# Patient Record
Sex: Male | Born: 1940 | Race: White | Hispanic: No | Smoking: Former smoker
Health system: Southern US, Community
[De-identification: ages and names within clinical notes are randomized; demographics above are authoritative.]

## PROBLEM LIST (undated history)

## (undated) DIAGNOSIS — E119 Type 2 diabetes mellitus without complications: Secondary | ICD-10-CM

## (undated) DIAGNOSIS — I714 Abdominal aortic aneurysm, without rupture, unspecified: Secondary | ICD-10-CM

## (undated) DIAGNOSIS — I10 Essential (primary) hypertension: Secondary | ICD-10-CM

## (undated) DIAGNOSIS — I340 Nonrheumatic mitral (valve) insufficiency: Secondary | ICD-10-CM

## (undated) DIAGNOSIS — M1990 Unspecified osteoarthritis, unspecified site: Secondary | ICD-10-CM

## (undated) DIAGNOSIS — I4891 Unspecified atrial fibrillation: Secondary | ICD-10-CM

## (undated) DIAGNOSIS — I219 Acute myocardial infarction, unspecified: Secondary | ICD-10-CM

## (undated) DIAGNOSIS — I701 Atherosclerosis of renal artery: Secondary | ICD-10-CM

## (undated) DIAGNOSIS — I2510 Atherosclerotic heart disease of native coronary artery without angina pectoris: Secondary | ICD-10-CM

## (undated) DIAGNOSIS — E785 Hyperlipidemia, unspecified: Secondary | ICD-10-CM

## (undated) DIAGNOSIS — I249 Acute ischemic heart disease, unspecified: Secondary | ICD-10-CM

## (undated) DIAGNOSIS — R37 Sexual dysfunction, unspecified: Secondary | ICD-10-CM

## (undated) HISTORY — DX: Sexual dysfunction, unspecified: R37

## (undated) HISTORY — DX: Acute myocardial infarction, unspecified: I21.9

## (undated) HISTORY — DX: Atherosclerosis of renal artery: I70.1

## (undated) HISTORY — DX: Unspecified osteoarthritis, unspecified site: M19.90

## (undated) HISTORY — DX: Abdominal aortic aneurysm, without rupture: I71.4

## (undated) HISTORY — PX: ESOPHAGOGASTRODUODENOSCOPY: SHX1529

## (undated) HISTORY — DX: Hyperlipidemia, unspecified: E78.5

## (undated) HISTORY — PX: FOOT SURGERY: SHX648

## (undated) HISTORY — DX: Atherosclerotic heart disease of native coronary artery without angina pectoris: I25.10

## (undated) HISTORY — DX: Nonrheumatic mitral (valve) insufficiency: I34.0

## (undated) HISTORY — DX: Essential (primary) hypertension: I10

## (undated) HISTORY — DX: Unspecified atrial fibrillation: I48.91

## (undated) HISTORY — PX: CIRCUMCISION: SUR203

## (undated) HISTORY — DX: Abdominal aortic aneurysm, without rupture, unspecified: I71.40

---

## 2000-09-19 ENCOUNTER — Encounter: Admission: RE | Admit: 2000-09-19 | Discharge: 2000-09-19 | Payer: Self-pay | Admitting: Gastroenterology

## 2000-09-19 ENCOUNTER — Encounter: Payer: Self-pay | Admitting: Gastroenterology

## 2004-07-15 ENCOUNTER — Ambulatory Visit: Payer: Self-pay | Admitting: Family Medicine

## 2004-08-21 ENCOUNTER — Ambulatory Visit: Payer: Self-pay | Admitting: Family Medicine

## 2004-09-06 DIAGNOSIS — I701 Atherosclerosis of renal artery: Secondary | ICD-10-CM

## 2004-09-06 HISTORY — DX: Atherosclerosis of renal artery: I70.1

## 2004-09-06 HISTORY — PX: CARDIAC CATHETERIZATION: SHX172

## 2004-10-16 ENCOUNTER — Ambulatory Visit: Payer: Self-pay | Admitting: Family Medicine

## 2005-01-06 ENCOUNTER — Ambulatory Visit: Payer: Self-pay | Admitting: Family Medicine

## 2005-01-27 ENCOUNTER — Ambulatory Visit: Payer: Self-pay | Admitting: Family Medicine

## 2005-05-12 ENCOUNTER — Ambulatory Visit: Payer: Self-pay | Admitting: Family Medicine

## 2005-07-13 ENCOUNTER — Ambulatory Visit: Payer: Self-pay | Admitting: Family Medicine

## 2005-07-16 ENCOUNTER — Ambulatory Visit: Payer: Self-pay | Admitting: Family Medicine

## 2005-07-19 ENCOUNTER — Ambulatory Visit: Payer: Self-pay | Admitting: Family Medicine

## 2005-07-22 ENCOUNTER — Ambulatory Visit: Payer: Self-pay | Admitting: Family Medicine

## 2005-09-07 ENCOUNTER — Ambulatory Visit: Payer: Self-pay | Admitting: Family Medicine

## 2005-09-30 ENCOUNTER — Ambulatory Visit: Payer: Self-pay | Admitting: Family Medicine

## 2005-11-24 ENCOUNTER — Ambulatory Visit: Payer: Self-pay | Admitting: Family Medicine

## 2009-12-04 ENCOUNTER — Ambulatory Visit: Payer: Self-pay | Admitting: Cardiovascular Disease

## 2009-12-12 ENCOUNTER — Telehealth: Payer: Self-pay | Admitting: Cardiovascular Disease

## 2010-03-31 ENCOUNTER — Ambulatory Visit (HOSPITAL_BASED_OUTPATIENT_CLINIC_OR_DEPARTMENT_OTHER): Admission: RE | Admit: 2010-03-31 | Discharge: 2010-03-31 | Payer: Self-pay | Admitting: Urology

## 2010-06-16 ENCOUNTER — Ambulatory Visit: Payer: Self-pay | Admitting: Cardiovascular Disease

## 2010-06-19 ENCOUNTER — Telehealth: Payer: Self-pay | Admitting: Cardiovascular Disease

## 2010-10-06 NOTE — Progress Notes (Signed)
  Phone Note Outgoing Call   Call placed by: Dessie Coma  LPN,  June 19, 2010 12:11 PM Call placed to: Patient Summary of Call: Patient's wife notified per Dr. Kirke Corin, ultrasound is fine.  Showed no aneurysm.

## 2010-10-06 NOTE — Progress Notes (Signed)
  Phone Note Outgoing Call   Call placed by: Dessie Coma LPN Call placed to: Patient Summary of Call: LM with wife- per Dr. Freida Busman, Echo showed normal heart function.

## 2010-11-21 LAB — BASIC METABOLIC PANEL
BUN: 22 mg/dL (ref 6–23)
CO2: 26 mEq/L (ref 19–32)
Calcium: 10.1 mg/dL (ref 8.4–10.5)
Chloride: 108 mEq/L (ref 96–112)
Creatinine, Ser: 1.44 mg/dL (ref 0.4–1.5)
GFR calc Af Amer: 59 mL/min — ABNORMAL LOW (ref >60)
GFR calc non Af Amer: 49 mL/min — ABNORMAL LOW (ref >60)
Glucose, Bld: 126 mg/dL — ABNORMAL HIGH (ref 70–99)
Potassium: 4.3 mEq/L (ref 3.5–5.1)
Sodium: 139 mEq/L (ref 135–145)

## 2010-11-21 LAB — PROTIME-INR
INR: 1.1 (ref 0.00–1.49)
Prothrombin Time: 14.1 seconds (ref 11.6–15.2)

## 2010-11-21 LAB — APTT: aPTT: 28 seconds (ref 24–37)

## 2010-11-21 LAB — POCT HEMOGLOBIN-HEMACUE: Hemoglobin: 15.2 g/dL (ref 13.0–17.0)

## 2010-12-15 ENCOUNTER — Encounter: Payer: Self-pay | Admitting: Cardiovascular Disease

## 2010-12-17 ENCOUNTER — Encounter: Payer: Self-pay | Admitting: Cardiovascular Disease

## 2010-12-17 ENCOUNTER — Ambulatory Visit (INDEPENDENT_AMBULATORY_CARE_PROVIDER_SITE_OTHER): Payer: Self-pay | Admitting: Cardiovascular Disease

## 2010-12-17 DIAGNOSIS — I2510 Atherosclerotic heart disease of native coronary artery without angina pectoris: Secondary | ICD-10-CM

## 2010-12-17 DIAGNOSIS — I10 Essential (primary) hypertension: Secondary | ICD-10-CM | POA: Insufficient documentation

## 2010-12-17 DIAGNOSIS — I4891 Unspecified atrial fibrillation: Secondary | ICD-10-CM

## 2010-12-17 NOTE — Assessment & Plan Note (Signed)
Chronic. His ventricular rate is well controlled with Diltiazem and Coreg. Continue long term with Warfarin with a goal INR of 2-3.

## 2010-12-17 NOTE — Assessment & Plan Note (Signed)
His BP is elevated. He was started on Coreg recently. 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 Ultrasound from last year showed a right renal size of 10.4 cm (with cortical thinning) compared to 13.5 on left.  Will continue medical therapy.

## 2010-12-17 NOTE — Progress Notes (Signed)
HPI  Marc [name] is a 70 y/o male who is here for a routine follow up visit. He has been doing well. No chest pain, dyspnea, palpitations or dizziness. His blood pressure has been running high recently. He was started on Coreg by PCP.   No Known Allergies   Current Outpatient Prescriptions on File Prior to Visit  Medication Sig Dispense Refill  . BENAZEPRIL HCL PO Take 20 mg by mouth 2 (two) times daily.        . Desloratadine (CLARINEX PO) Take 1 tablet by mouth at bedtime.        Marland Kitchen diltiazem (CARDIZEM CD) 300 MG 24 hr capsule Take 300 mg by mouth daily.        . FENOFIBRATE PO Take 200 mg by mouth 2 (two) times daily.        . pravastatin (PRAVACHOL) 40 MG tablet Take 40 mg by mouth daily.        . traMADol-acetaminophen (ULTRACET) 37.5-325 MG per tablet Take 1 tablet by mouth every 6 (six) hours as needed.        . warfarin (COUMADIN) 5 MG tablet Take 5 mg by mouth daily. Take as directed          Past Medical History  Diagnosis Date  . Renal artery stenosis   . Hyperlipidemia   . Sexual dysfunction   . Arthritis   . AAA (abdominal aortic aneurysm)     small. An abdominal ultrasound in 2001 showed no aneurysm  . Atrial fibrillation     permanent  . Coronary artery disease   . Hypertension      Past Surgical History  Procedure Date  . Cardiac catheterization 2006    showed an occluded Left circumflex with collaterals as well as 60% mid right coronary artery stenosis     No family history on file.   History   Social History  . Marital Status: Married    Spouse Name: N/A    Number of Children: N/A  . Years of Education: N/A   Occupational History  . Not on file.   Social History Main Topics  . Smoking status: Former Smoker    Quit date: 12/15/1983  . Smokeless tobacco: Not on file  . Alcohol Use: No  . Drug Use: No  . Sexually Active:    Other Topics Concern  . Not on file   Social History Narrative  . No narrative on file     ROS Constitutional:  Negative for fever, chills, diaphoresis, activity change, appetite change and fatigue.  HENT: Negative for hearing loss, nosebleeds, congestion, sore throat, facial swelling, drooling, trouble swallowing, neck pain, voice change, sinus pressure and tinnitus.  Eyes: Negative for photophobia, pain, discharge and visual disturbance.  Respiratory: Negative for apnea, cough, chest tightness, shortness of breath and wheezing.  Cardiovascular: Negative for chest pain, palpitations and leg swelling.  Gastrointestinal: Negative for nausea, vomiting, abdominal pain, diarrhea, constipation, blood in stool and abdominal distention.  Genitourinary: Negative for dysuria, urgency, frequency, hematuria and decreased urine volume.  Musculoskeletal: Negative for myalgias, back pain, joint swelling, arthralgias and gait problem.  Skin: Negative for color change, pallor, rash and wound.  Neurological: Negative for dizziness, tremors, seizures, syncope, speech difficulty, weakness, light-headedness, numbness and headaches.  Psychiatric/Behavioral: Negative for suicidal ideas, hallucinations, behavioral problems and agitation. The patient is not nervous/anxious.     PHYSICAL EXAM   BP 146/97  Pulse 84  Wt 205 lb 3.2 oz (93.078 kg)  SpO2 96%  Constitutional: He is oriented to person, place, and time. He appears well-developed and well-nourished. No distress.  HENT: No nasal discharge.  Head: Normocephalic and atraumatic.  Eyes: Pupils are equal, round, and reactive to light. Right eye exhibits no discharge. Left eye exhibits no discharge.  Neck: Normal range of motion. Neck supple. No JVD present. No thyromegaly present.  Cardiovascular: Normal rate, irrregular rhythm, normal heart sounds and intact distal pulses. Exam reveals no gallop and no friction rub.  No murmur heard.  Pulmonary/Chest: Effort normal and breath sounds normal. No stridor. No respiratory distress. He has no wheezes. He has no rales. He  exhibits no tenderness.  Abdominal: Soft. Bowel sounds are normal. He exhibits no distension. There is no tenderness. There is no rebound and no guarding.  Musculoskeletal: Normal range of motion. He exhibits no edema and no tenderness.  Neurological: He is alert and oriented to person, place, and time. Coordination normal.  Skin: Skin is warm and dry. No rash noted. He is not diaphoretic. No erythema. No pallor.  Psychiatric: He has a normal mood and affect. His behavior is normal. Judgment and thought content normal.        ASSESSMENT AND PLAN

## 2010-12-17 NOTE — Assessment & Plan Note (Addendum)
Without symptoms of angina at this time. Continue medical therapy. He is not on Aspirin given that he is on Warfarin without any history of PCI.

## 2011-01-11 ENCOUNTER — Telehealth: Payer: Self-pay | Admitting: Cardiovascular Disease

## 2011-01-19 NOTE — Assessment & Plan Note (Signed)
[facility]                        [place] [facility]IOLOG[facility] OFFICE NOTE   NAME:[name], Marc COLIN                      MRN:          [id number]  DATE:06/16/2010                            DOB:          [birth year]    [name] is a 70 year old gentleman who is here today for a followup  visit.  He has the following problem list:  1. Permanent atrial fibrillation.  2. Coronary artery disease with cardiac catheterization in 2006, which      showed an occluded left circumflex with collaterals as well as 60%      mid right coronary artery stenosis.  3. Renal artery stenosis detected during cardiac catheterization.  He      had subtotal occlusion of the right renal artery.  He was treated      medically given that he was asymptomatic without uncontrolled      hypertension.  4. A small abdominal aortic aneurysm.  5. Hypertension.  6. Hyperlipidemia.   INTERIM HISTORY:  Mr. Miske has been doing well overall with no reported  symptoms of chest pain, palpitations, dizziness, syncope, or presyncope.  He has chronic exertional dyspnea which has not changed recently.  There  has been no lower extremity edema.  He has been taking Coumadin without  any reported side effects.   MEDICATIONS:  1. Pravastatin 40 mg once daily.  2. Diltiazem extended release 300 mg once daily.  3. Fenofibrate 200 mg once daily.  4. Benazepril 20 mg twice daily.  5. Clarinex.  6. Coumadin as directed.   PHYSICAL EXAMINATION:  VITAL SIGNS:  Weight is 204.8 pounds, blood  pressure is 142/78, pulse is 74, oxygen saturation is 97% on room air.  NECK:  Reveals no JVD or carotid bruits.  LUNGS:  Clear to auscultation.  HEART:  Irregularly irregular with no gallops or murmurs.  ABDOMEN:  Benign, nontender, nondistended.  EXTREMITIES:  With no clubbing, cyanosis, or edema.   IMPRESSION:  1. Permanent atrial fibrillation:  His heart rate seems to be      reasonably controlled with diltiazem.   Most recent echocardiogram      in April of 2011, showed normal left ventricular systolic function,      moderately dilated left atrium, mitral valve prolapse with mild-to-      moderate mitral regurgitation.  At this time, I recommend      continuing rate control with diltiazem and long-term      anticoagulation with Coumadin as is being done.  2. Coronary artery disease:  He is currently not having any symptoms      suggestive of angina.  We will continue with medical therapy.  3. Hyperlipidemia:  Due to his history of coronary artery disease, I      recommended goals for his LDL should be less than 70.  4. History of abdominal aortic aneurysm:  We will obtain an ultrasound      of the aorta given that this has not been evaluated in the last few      years.  He will follow up with me in 6 months from now  or earlier      if needed.     Lorine Bears, MD  Electronically Signed    MA/MedQ  DD: 06/16/2010  DT: 06/16/2010  Job #: [id number]

## 2011-01-19 NOTE — Assessment & Plan Note (Signed)
[facility]                        [place] [facility]ARDIOLOG[facility] OFFICE NOTE   NAME:Marc [name], Marc [name]                      MRN:          [id number]  DATE:12/04/2009                            DOB:          Jan 23, 1941    CHIEF COMPLAINT:  Atrial fibrillation, hypertension, changing  cardiovascular care.   HISTORY OF PRESENT ILLNESS:  Marc [name] is a 70 year old white male, past  medical history significant for coronary artery disease with left heart  catheterization several years ago, atrial fibrillation on Coumadin,  hypertension, hyperlipidemia who is presenting to reestablish  cardiovascular care.  The patient states that approximately 3 years ago,  he underwent a heart catheterization for unclear reasons which  demonstrated coronary artery disease; however, no interventions were  taken secondary to collateral circulation per the patient.  He was  diagnosed with atrial fibrillation around that time and has been on  Coumadin since then.  He is asymptomatic from the atrial fibrillation.  He is retired from his job in [employer] but stays relatively active.  He denies any chest discomfort or shortness of breath other than  shortness of breath associated with some allergies during particular  seasons.  He also denies any dizziness or lower extremity edema.  He is  compliant with his medications.   PAST MEDICAL HISTORY:  As above in HPI.   SOCIAL HISTORY:  Quit smoking 30 years ago, does not use alcohol.   FAMILY HISTORY:  Positive for premature coronary artery disease.   ALLERGIES:  No known drug allergies.   MEDICATIONS:  Coumadin, diltiazem 300 mg daily, benazepril 20 mg b.i.d.,  fenofibrate 20 mg daily, pravastatin 40 mg every evening, Jantoven,  Clarinex.   REVIEW OF SYSTEMS:  As in HPI.  The patient also states he had skin  cancer in the past, it was not melanoma.  Other systems as in HPI,  otherwise negative.   PHYSICAL EXAMINATION:  VITAL SIGNS:   Blood pressure 136/76, pulse 57,  satting 96% on room air, and he weighs 204 pounds.  GENERAL:  No acute distress.  HEENT:  Normocephalic, atraumatic.  NECK:  Supple.  There is no JVD.  There are no carotid bruits.  HEART:  Irregularly irregular with a normal rate.  No murmur, rub, or  gallop.  LUNGS:  Clear bilaterally.  ABDOMEN:  Soft, nontender, nondistended.  EXTREMITIES:  Without edema.  SKIN:  Warm and dry.  PSYCHIATRIC:  The patient is appropriate with normal levels of insight.  Pulses 2+ bilateral radial and carotid pulses.  MUSCULOSKELETAL:  Bilateral upper and lower extremity strength 5/5.   Review of the patient's stress test in 2007, ejection fraction was 58%.  There is no ischemia seen on the scan.  Labs dated November 13, 2009,  sodium 142, potassium 4.2, chloride 105, CO2 23, BUN 16, creatinine 1.3,  glucose 118.  CBC, white count of 8, hemoglobin 14.5, hematocrit 42.3,  platelet count 186, total cholesterol 145, triglycerides 111, HDL 32,  LDL 91, hemoglobin A1c 6.6.  EKG taken today in clinic, independently  interpreted by myself demonstrates atrial fibrillation with a  ventricular rate of 79  beats per minute.   ASSESSMENT:  A 70 year old male with history of coronary artery disease  and chronic atrial fibrillation who is not having any symptoms  consistent with angina.  His atrial fibrillation is asymptomatic and has  been asymptomatic despite the tachycardia in the past.   PLAN:  1. Coronary artery disease.  The patient states he has not been taking      aspirin because he is on Coumadin.  He does not have a history of      myocardial infarction and for now will continue on the Coumadin.      He is on a statin.  2. Atrial fibrillation.  The patient is on diltiazem for rate control      on Coumadin for anticoagulation.  We will check a transthoracic      echocardiogram to ensure normal left ventricular systolic function      as the patient does state he has some  shortness of breath, although      he believes it is from his allergies.  I will also place him on a      24-hour Holter monitor to assure adequate rate control as the      patient is completely asymptomatic from the atrial fibrillation.  3. Hyperlipidemia.  He is currently taking pravastatin 40 mg daily,      and his LDL is 90.  This could be increased to 80 mg.  We will      defer this to his primary care physician who is following him      regularly.  4. Hypertension.  His blood pressure is under adequate control on      diltiazem and benazepril.  We will contact the patient once results      of his echo and the cardiac monitor are obtained.  He can follow up      with [place] in 6 months and more regularly with Dr. Benedetto Goad.     Brayton El, MD  Electronically Signed    SGA/MedQ  DD: 12/04/2009  DT: 12/05/2009  Job #: [id number]

## 2011-01-19 NOTE — Letter (Signed)
December 04, 2009    Elease Hashimoto A. Benedetto Goad, M.D.  [address]  [place], [place] [address]   RE:  Marc [name], Marc [name]  MRN:  [id number]  /  DOB:  1941/04/19   Dear Dr. Benedetto Goad,   I had the pleasure of seeing your patient, Marc [name], in clinic this  morning.  As you know he is a 70 year old male with a history of  coronary disease and chronic atrial fibrillation.  He is currently not  experiencing any angina and he is asymptomatic from his atrial  fibrillation.  I have ordered a 24-hour cardiac event monitor in order  to assure adequate rate control.  I am also checking a transthoracic  echocardiogram to evaluate his left ventricular systolic function, as he  does occasionally have some shortness of breath.  His last LDL was 91 on  pravastatin 40 mg daily.  This could be increased to 80 mg or the agent  could be changed to simvastatin or atorvastatin.  I will leave this to  your discretion.   I thank you for the referral of this patient and please contact my  office if I can be of any further assistance.    Sincerely,      Brayton El, MD  Electronically Signed    SGA/MedQ  DD: 12/04/2009  DT: 12/04/2009  Job #: [id number]

## 2011-02-05 HISTORY — PX: CARDIAC CATHETERIZATION: SHX172

## 2011-02-08 ENCOUNTER — Telehealth: Payer: Self-pay | Admitting: Cardiovascular Disease

## 2011-02-08 NOTE — Telephone Encounter (Signed)
Pts wife called and stated he was short of breath x 1 week.  Can't do anything without getting short of breath.  No edema in feet.  At night it is worse, can't seem to get any air.

## 2011-02-08 NOTE — Telephone Encounter (Signed)
Spoke with Marc [name], he reports being SOB for one to two weeks. He denies any edema and reports the SOB with any exertion. He is unable to tell if his heart rate is elevated or not. Marc [name] states he gets SOB when he lies down but it goes away. Marc [name] made an appt to see dr Kirke Corin on Monday 02-15-11. He will call prior to appt if symptoms change. Marc [name]

## 2011-02-15 ENCOUNTER — Ambulatory Visit (INDEPENDENT_AMBULATORY_CARE_PROVIDER_SITE_OTHER): Payer: Medicare Other | Admitting: Cardiovascular Disease

## 2011-02-15 ENCOUNTER — Encounter: Payer: Self-pay | Admitting: *Deleted

## 2011-02-15 ENCOUNTER — Encounter: Payer: Self-pay | Admitting: Cardiovascular Disease

## 2011-02-15 DIAGNOSIS — I4891 Unspecified atrial fibrillation: Secondary | ICD-10-CM

## 2011-02-15 DIAGNOSIS — I340 Nonrheumatic mitral (valve) insufficiency: Secondary | ICD-10-CM | POA: Insufficient documentation

## 2011-02-15 DIAGNOSIS — R0600 Dyspnea, unspecified: Secondary | ICD-10-CM

## 2011-02-15 DIAGNOSIS — I059 Rheumatic mitral valve disease, unspecified: Secondary | ICD-10-CM

## 2011-02-15 DIAGNOSIS — I2510 Atherosclerotic heart disease of native coronary artery without angina pectoris: Secondary | ICD-10-CM

## 2011-02-15 DIAGNOSIS — R079 Chest pain, unspecified: Secondary | ICD-10-CM

## 2011-02-15 NOTE — Patient Instructions (Signed)
Your physician recommends that you schedule a follow-up appointment in: after your test  Your physician has requested that you have an echocardiogram. Echocardiography is a painless test that uses sound waves to create images of your heart. It provides your doctor with information about the size and shape of your heart and how well your heart's chambers and valves are working. This procedure takes approximately one hour. There are no restrictions for this procedure.  Your physician has requested that you have a cardiac catheterization. Cardiac catheterization is used to diagnose and/or treat various heart conditions. Doctors may recommend this procedure for a number of different reasons. The most common reason is to evaluate chest pain. Chest pain can be a symptom of coronary artery disease (CAD), and cardiac catheterization can show whether plaque is narrowing or blocking your heart's arteries. This procedure is also used to evaluate the valves, as well as measure the blood flow and oxygen levels in different parts of your heart. For further information please visit [email or web address] Please follow instruction sheet, as given.

## 2011-02-15 NOTE — Assessment & Plan Note (Signed)
His heart rate is well controlled with diltiazem and carvedilol.

## 2011-02-15 NOTE — Progress Notes (Signed)
HPI  This is a 70 year old male who is being followed for chronic atrial fibrillation as well as coronary artery disease. He was last seen in April of this year. At that time he was doing very well with no reported chest pain or dyspnea. However, over the last 2-3 weeks the patient had progressive symptoms of dyspnea with minimal activities that currently interfering with his activities of daily living. He has not been able to do his yard work or other basic activities that he was able to do easily. He did have a few episodes of chest pain but most of his symptoms are manifested by dyspnea. He does describe difficulty in lying down but no PND. He has not had any lower extremity edema. He saw Dr. Shary Decamp recently and had labs checked which according to the patient were unremarkable. The patient has not had any palpitations or tachycardia. His atrial fibrillation has been reasonably controlled. The patient feels that he is gasping for air. He feels better breathing through his mouth.  No Known Allergies   Current Outpatient Prescriptions on File Prior to Visit  Medication Sig Dispense Refill  . BENAZEPRIL HCL PO Take 20 mg by mouth 2 (two) times daily.        . carvedilol (COREG) 12.5 MG tablet Take 12.5 mg by mouth 2 (two) times daily with a meal.        . diltiazem (CARDIZEM CD) 300 MG 24 hr capsule Take 300 mg by mouth daily.        . FENOFIBRATE PO Take 200 mg by mouth 2 (two) times daily.        . pravastatin (PRAVACHOL) 40 MG tablet Take 40 mg by mouth daily.        . traMADol-acetaminophen (ULTRACET) 37.5-325 MG per tablet Take 1 tablet by mouth every 6 (six) hours as needed.        . warfarin (COUMADIN) 5 MG tablet Take 5 mg by mouth daily. Take as directed       . DISCONTD: Desloratadine (CLARINEX PO) Take 1 tablet by mouth at bedtime.           Past Medical History  Diagnosis Date  . Renal artery stenosis   . Hyperlipidemia   . Sexual dysfunction   . Arthritis   . Hypertension   .  Renal artery stenosis in 1 of 2 vessels 2006    subtotal occlusion of right renal artery. Treated medically.   . Atrial fibrillation     permanent  . AAA (abdominal aortic aneurysm)     small. An abdominal ultrasound in 2011 showed no aneurysm  . Coronary artery disease   . Mitral regurgitation     with MVP. Mild to moderate     Past Surgical History  Procedure Date  . Cardiac catheterization 2006    showed an occluded Left circumflex with collaterals as well as 60% mid right coronary artery stenosis     History reviewed. No pertinent family history.   History   Social History  . Marital Status: Married    Spouse Name: N/A    Number of Children: N/A  . Years of Education: N/A   Occupational History  . Not on file.   Social History Main Topics  . Smoking status: Former Smoker    Quit date: 12/15/1983  . Smokeless tobacco: Not on file  . Alcohol Use: No  . Drug Use: No  . Sexually Active:    Other Topics Concern  . Not  on file   Social History Narrative  . No narrative on file       PHYSICAL EXAM   BP 147/94  Pulse 66  Ht 6\' 1"  (1.854 m)  Wt 202 lb (91.627 kg)  BMI 26.65 kg/m2  SpO2 95% Constitutional: He is oriented to person, place, and time. He appears well-developed and well-nourished. No distress.  HENT: No nasal discharge.  Head: Normocephalic and atraumatic.  Eyes: Pupils are equal, round, and reactive to light. Right eye exhibits no discharge. Left eye exhibits no discharge.  Neck: Normal range of motion. Neck supple. No JVD present. No thyromegaly present.  Cardiovascular: Irregularly irregular rhythm, normal heart sounds and intact distal pulses. Exam reveals no gallop and no friction rub.  No murmur heard.  Pulmonary/Chest: Effort normal and breath sounds normal. No stridor. No respiratory distress. He has no wheezes. He has no rales. He exhibits no tenderness.  Abdominal: Soft. Bowel sounds are normal. He exhibits no distension. There is  no tenderness. There is no rebound and no guarding.  Musculoskeletal: Normal range of motion. He exhibits no edema and no tenderness.  Neurological: He is alert and oriented to person, place, and time. Coordination normal.  Skin: Skin is warm and dry. No rash noted. He is not diaphoretic. No erythema. No pallor.  Psychiatric: He has a normal mood and affect. His behavior is normal. Judgment and thought content normal.       EKG: Atrial fibrillation with a ventricular rate of 65 beats per minute. No significant ST or T wave changes. No Q waves.   ASSESSMENT AND PLAN

## 2011-02-15 NOTE — Assessment & Plan Note (Addendum)
The patient's current symptoms of increased exertional dyspnea and occasional chest pain worrisome especially that has been stable for a while from a cardiac standpoint. His atrial fibrillation rate seems to be controlled and that does not seem to be an issue. The patient is known to have a history of coronary artery disease. Cardiac catheterization in 2006 showed an occluded left circumflex with a 60% lesion in the right coronary artery. I am concerned about the possibility of progression of his coronary artery disease. Due to severity of his symptoms, I recommend proceeding with cardiac catheterization and possible coronary intervention. Would hold his warfarin for 5 days before cardiac catheter and will likely do it through the radial artery. If PCI is needed, would favor a bare-metal stent. Start aspirin 81 mg daily.

## 2011-02-15 NOTE — Assessment & Plan Note (Signed)
The patient has known history of mitral valve prolapse with mild to moderate mitral regurgitation. Due to worsening of his symptoms, I will obtain an echocardiogram before his cardiac catheterization.

## 2011-02-16 ENCOUNTER — Telehealth: Payer: Self-pay | Admitting: *Deleted

## 2011-02-16 MED ORDER — ASPIRIN EC 81 MG PO TBEC: 81.0000 mg | DELAYED_RELEASE_TABLET | Freq: Every day | ORAL | Status: DC

## 2011-02-16 NOTE — Telephone Encounter (Signed)
Spoke with Ms Rivadeneira and told her that Dr Kirke Corin wants him to start taking an ASA 81 mg daily.

## 2011-02-17 ENCOUNTER — Encounter: Payer: Self-pay | Admitting: *Deleted

## 2011-02-19 ENCOUNTER — Ambulatory Visit (HOSPITAL_COMMUNITY): Payer: Medicare Other | Attending: Cardiovascular Disease

## 2011-02-19 DIAGNOSIS — R0600 Dyspnea, unspecified: Secondary | ICD-10-CM

## 2011-02-19 DIAGNOSIS — R0609 Other forms of dyspnea: Secondary | ICD-10-CM | POA: Insufficient documentation

## 2011-02-19 DIAGNOSIS — R0989 Other specified symptoms and signs involving the circulatory and respiratory systems: Secondary | ICD-10-CM | POA: Insufficient documentation

## 2011-02-19 DIAGNOSIS — I2510 Atherosclerotic heart disease of native coronary artery without angina pectoris: Secondary | ICD-10-CM | POA: Insufficient documentation

## 2011-02-19 DIAGNOSIS — R072 Precordial pain: Secondary | ICD-10-CM

## 2011-02-19 DIAGNOSIS — I059 Rheumatic mitral valve disease, unspecified: Secondary | ICD-10-CM | POA: Insufficient documentation

## 2011-02-19 DIAGNOSIS — I10 Essential (primary) hypertension: Secondary | ICD-10-CM | POA: Insufficient documentation

## 2011-02-19 DIAGNOSIS — I4891 Unspecified atrial fibrillation: Secondary | ICD-10-CM | POA: Insufficient documentation

## 2011-02-24 ENCOUNTER — Ambulatory Visit (HOSPITAL_COMMUNITY)
Admission: RE | Admit: 2011-02-24 | Discharge: 2011-02-24 | Disposition: A | Discharge status: Home / Self Care | Payer: Medicare Other | Source: Ambulatory Visit | Attending: Cardiovascular Disease | Admitting: Cardiovascular Disease

## 2011-02-24 DIAGNOSIS — I2510 Atherosclerotic heart disease of native coronary artery without angina pectoris: Secondary | ICD-10-CM

## 2011-02-24 DIAGNOSIS — Z01812 Encounter for preprocedural laboratory examination: Secondary | ICD-10-CM | POA: Insufficient documentation

## 2011-02-24 DIAGNOSIS — R0609 Other forms of dyspnea: Secondary | ICD-10-CM | POA: Insufficient documentation

## 2011-02-24 DIAGNOSIS — Z79899 Other long term (current) drug therapy: Secondary | ICD-10-CM | POA: Insufficient documentation

## 2011-02-24 DIAGNOSIS — R0989 Other specified symptoms and signs involving the circulatory and respiratory systems: Secondary | ICD-10-CM | POA: Insufficient documentation

## 2011-02-24 DIAGNOSIS — I4891 Unspecified atrial fibrillation: Secondary | ICD-10-CM | POA: Insufficient documentation

## 2011-02-24 LAB — CBC
HCT: 42.6 % (ref 39.0–52.0)
MCH: 30.3 pg (ref 26.0–34.0)
MCV: 88.4 fL (ref 78.0–100.0)
Platelets: 144 10*3/uL — ABNORMAL LOW (ref 150–400)
RBC: 4.82 MIL/uL (ref 4.22–5.81)

## 2011-02-24 LAB — BASIC METABOLIC PANEL
BUN: 20 mg/dL (ref 6–23)
CO2: 24 mEq/L (ref 19–32)
Chloride: 107 mEq/L (ref 96–112)
GFR calc non Af Amer: 49 mL/min — ABNORMAL LOW (ref >60)
Glucose, Bld: 137 mg/dL — ABNORMAL HIGH (ref 70–99)
Potassium: 4.3 mEq/L (ref 3.5–5.1)
Sodium: 140 mEq/L (ref 135–145)

## 2011-03-03 NOTE — Cardiovascular Report (Signed)
NAMECHISOM, AUST NO.:  [id number]  MEDICAL RECORD NO.:  [id number]  LOCATION:  [facility]                         FACILITY:  [facility]  PHYSICIAN:  Lorine Bears, MD     DATE OF BIRTH:  1940-11-05  DATE OF PROCEDURE: DATE OF DISCHARGE:  02/24/2011                           CARDIAC CATHETERIZATION   PRIMARY CARE PHYSICIAN:  Feliciana Rossetti, MD in [place].  PROCEDURES PERFORMED: 1. Left heart catheterization. 2. Coronary angiography. 3. Left ventricular angiography.  INDICATIONS AND CLINICAL HISTORY:  This is a 70 year old gentleman with chronic atrial fibrillation and known history of coronary artery disease who presented with symptoms of dyspnea with minimal activities, which has been progressive.  Symptoms were severe.  Due to severity of symptoms and previous history of known coronary artery disease, cardiac catheterization and possible coronary intervention was recommended. Risks, benefits, and alternatives were discussed with the patient.  ACCESS:  Right radial artery.  STUDY DETAILS:  A standard informed consent was obtained.  The right wrist was prepped in a sterile fashion.  It was anesthetized with 1% lidocaine.  A 5-French sheath was placed in the right radial artery after an anterior puncture.  Verapamil 3 mg was given through the sheath.  4000 units of unfractionated heparin was given intravenously. Coronary and left ventricular angiography was performed with a Jackie catheter.  The patient tolerated the procedure well with no immediate complications.  The sheath was then removed.  A TR band was applied.  STUDY FINDINGS:  Hemodynamic findings:  Central aortic pressure is 126/62 with a mean pressure of 84 mmHg.  Left ventricular pressure is 127/8 with left ventricular end-diastolic pressure of 15 mmHg.  Left ventricular angiography:  This was done with hand injection.  It showed mildly reduced LV systolic function with an estimated  ejection fraction of 45-50%.  Coronary angiography: Left main coronary artery:  The vessel is normal in size with 20% mid eccentric lesion. Left anterior descending artery:  The vessel is normal in size with mild- to-moderate calcifications especially in the proximal segment.  There is a 20% proximal stenosis at the first diagonal.  There is another 20% in the mid segment.  The rest of the vessel has minor irregularities. First diagonal is large in size and free of significant disease.  Second and third diagonals are normal in size and free of significant disease. Left circumflex artery:  The vessel is normal in size and nondominant. There is a 20% ostial lesion.  The rest of the vessel has minor irregularities without significant disease.  OM-1 and OM-2 are small branches.  OM-3 is normal in size and free of significant disease.  The AV groove artery appears to be small and possibly occluded, but it does not seem to supply a large territory. Right coronary artery:  The vessel is large size and dominant.  It is heavily calcified especially in the proximal and mid segments.  There is 40% discrete stenosis proximally followed by another 50-60% tubular stenosis in the proximal segment.  In the mid area, there is 30% diffuse disease and distally there is 40% diffuse disease.  The right PDA is normal in size and free of significant disease.  There are three posterolateral branches.  The largest is the second one and/or all are free of significant disease.  STUDY CONCLUSIONS: 1. Moderate right coronary artery disease without evidence of     obstructive coronary artery disease. 2. Mildly reduced LV systolic function (EF 45-50%) with mildly elevated left     ventricular end-diastolic pressure.  RECOMMENDATIONS:  Recommend medical therapy of nonobstructive coronary artery disease.  Continue rate control for atrial fibrillation and long- term anticoagulation.  Warfarin can be resumed  tonight.  We will consider adding a small dose diuretic.  Another consideration would be to stop diltiazem and switched to digoxin due to mildly reduced LV systolic function.  We will review his echocardiogram as well and make further adjustments in his medications.     Lorine Bears, MD     MA/MEDQ  D:  02/24/2011  T:  02/25/2011  Job:  [id number]  cc:   Feliciana Rossetti, MD  Electronically Signed by Lorine Bears MD on 03/03/2011 02:48:10 PM

## 2011-03-11 ENCOUNTER — Ambulatory Visit: Payer: Medicare Other | Admitting: Cardiovascular Disease

## 2011-03-22 ENCOUNTER — Encounter: Payer: Self-pay | Admitting: Cardiovascular Disease

## 2011-03-22 ENCOUNTER — Ambulatory Visit (INDEPENDENT_AMBULATORY_CARE_PROVIDER_SITE_OTHER): Payer: Medicare Other | Admitting: Cardiovascular Disease

## 2011-03-22 DIAGNOSIS — I2510 Atherosclerotic heart disease of native coronary artery without angina pectoris: Secondary | ICD-10-CM

## 2011-03-22 DIAGNOSIS — I340 Nonrheumatic mitral (valve) insufficiency: Secondary | ICD-10-CM

## 2011-03-22 DIAGNOSIS — I059 Rheumatic mitral valve disease, unspecified: Secondary | ICD-10-CM

## 2011-03-22 DIAGNOSIS — I4891 Unspecified atrial fibrillation: Secondary | ICD-10-CM

## 2011-03-22 NOTE — Assessment & Plan Note (Signed)
This is mild to moderate recent echocardiogram and unchanged from before.

## 2011-03-22 NOTE — Assessment & Plan Note (Signed)
This is chronic and being managed by rate control with carvedilol and diltiazem. Both would be continued. Ejection fraction was normal by echo. Continue long-term anticoagulation with warfarin with a goal INR between 2 and 3.

## 2011-03-22 NOTE — Assessment & Plan Note (Signed)
Recent cardiac catheterization showed no significant progression of coronary artery disease. I recommend continuing medical therapy as is being done. I suspect that his dyspnea is noncardiac. Although his left ventricular end-diastolic pressure was mildly elevated, I don't think that is contributing to his symptoms. He might have a problem with his airways. He is currently getting ENT evaluation. If that does not improve his symptoms, then consider evaluation by pulmonary.

## 2011-03-22 NOTE — Patient Instructions (Signed)
Your physician recommends that you schedule a follow-up appointment in: 6 months  

## 2011-03-22 NOTE — Progress Notes (Signed)
HPI  This is a 70 year old male who is here today for a followup visit. He was seen last month for increased dyspnea with minimal activities. He underwent cardiac catheterization which showed no progression of coronary artery disease with an ejection fraction of 50% with mildly elevated left ventricular end-diastolic pressure. Echocardiogram showed normal LV systolic function with mild to moderate mitral regurgitation and severely dilated left atrium likely due to atrial fibrillation. Overall, no change in his cardiac status. He denies any chest pain at this time. He still complains of dyspnea and feels that he does not get enough air inside his lungs especially when he breathes through his nose. He is getting ENT evaluation.  No Known Allergies   Current Outpatient Prescriptions on File Prior to Visit  Medication Sig Dispense Refill  . BENAZEPRIL HCL PO Take 20 mg by mouth 2 (two) times daily.        . carvedilol (COREG) 12.5 MG tablet Take 12.5 mg by mouth 2 (two) times daily with a meal.        . diltiazem (CARDIZEM CD) 300 MG 24 hr capsule Take 300 mg by mouth daily.        . pravastatin (PRAVACHOL) 40 MG tablet Take 40 mg by mouth daily.        . traMADol-acetaminophen (ULTRACET) 37.5-325 MG per tablet Take 1 tablet by mouth every 6 (six) hours as needed.        . warfarin (COUMADIN) 5 MG tablet Take 5 mg by mouth daily. Take as directed          Past Medical History  Diagnosis Date  . Renal artery stenosis   . Hyperlipidemia   . Sexual dysfunction   . Arthritis   . Hypertension   . Renal artery stenosis in 1 of 2 vessels 2006    subtotal occlusion of right renal artery. Treated medically.   . Atrial fibrillation     permanent  . AAA (abdominal aortic aneurysm)     small. An abdominal ultrasound in 2011 showed no aneurysm  . Coronary artery disease   . Mitral regurgitation     with MVP. Mild to moderate     Past Surgical History  Procedure Date  . Cardiac catheterization  2006    showed an occluded Left circumflex with collaterals as well as 60% mid right coronary artery stenosis  . Cardiac catheterization 02/2011    LM: 20%, LAD: calcified 20%, LCX: occluded distal AV groove supplying a small area, RCA: calcified with 50-60% proximal-mid disease     No family history on file.   History   Social History  . Marital Status: Married    Spouse Name: N/A    Number of Children: N/A  . Years of Education: N/A   Occupational History  . Not on file.   Social History Main Topics  . Smoking status: Former Smoker    Quit date: 12/15/1983  . Smokeless tobacco: Not on file  . Alcohol Use: No  . Drug Use: No  . Sexually Active:    Other Topics Concern  . Not on file   Social History Narrative  . No narrative on file      PHYSICAL EXAM   BP 143/77  Pulse 69  Ht 6\' 1"  (1.854 m)  Wt 205 lb (92.987 kg)  BMI 27.05 kg/m2  SpO2 96%  Constitutional: He is oriented to person, place, and time. He appears well-developed and well-nourished. No distress.  HENT: No nasal discharge.  Head: Normocephalic and atraumatic.  Eyes: Pupils are equal, round, and reactive to light. Right eye exhibits no discharge. Left eye exhibits no discharge.  Neck: Normal range of motion. Neck supple. No JVD present. No thyromegaly present.  Cardiovascular: Irregularly irregular, normal heart sounds and intact distal pulses. Exam reveals no gallop and no friction rub.  No murmur heard.  Pulmonary/Chest: Effort normal and breath sounds normal. No stridor. No respiratory distress. He has no wheezes. He has no rales. He exhibits no tenderness.  Abdominal: Soft. Bowel sounds are normal. He exhibits no distension. There is no tenderness. There is no rebound and no guarding.  Musculoskeletal: Normal range of motion. He exhibits no edema and no tenderness.  Neurological: He is alert and oriented to person, place, and time. Coordination normal.  Skin: Skin is warm and dry. No rash  noted. He is not diaphoretic. No erythema. No pallor.  Psychiatric: He has a normal mood and affect. His behavior is normal. Judgment and thought content normal.  The right radial area is intact with no evidence of hematoma.       ASSESSMENT AND PLAN

## 2011-04-06 LAB — PULMONARY FUNCTION TEST

## 2011-04-27 ENCOUNTER — Encounter: Payer: Self-pay | Admitting: Cardiovascular Disease

## 2011-09-30 ENCOUNTER — Encounter: Payer: Self-pay | Admitting: Pulmonary Disease

## 2011-09-30 ENCOUNTER — Ambulatory Visit (INDEPENDENT_AMBULATORY_CARE_PROVIDER_SITE_OTHER): Payer: Medicare Other | Admitting: Pulmonary Disease

## 2011-09-30 VITALS — BP 122/80 | HR 79 | Temp 98.1°F | Ht 73.0 in | Wt 208.6 lb

## 2011-09-30 DIAGNOSIS — J449 Chronic obstructive pulmonary disease, unspecified: Secondary | ICD-10-CM | POA: Insufficient documentation

## 2011-09-30 DIAGNOSIS — R0609 Other forms of dyspnea: Secondary | ICD-10-CM

## 2011-09-30 NOTE — Assessment & Plan Note (Signed)
The patient has dyspnea on exertion since May of last year, and I suspect it is multifactorial.  He does have airflow obstruction on his recent PFTs, however it is fairly mild.  He has no significant parenchymal lung disease on CT chest, and no thromboembolic disease.  He has been tried on symbicort at low dose and did not see a difference, but he may respond better to the higher dose.  I would first like to try him on Spiriva since he has significant air trapping by PFTs, and see how he responds.  The patient has definite heart disease with a severely dilated left atrium and mild to moderate mitral regurgitation, possibly suggesting an element of diastolic dysfunction as well.  I think conditioning may also be playing a role.

## 2011-09-30 NOTE — Patient Instructions (Signed)
Finish up your prednisone as instructed. Start spiriva one inhalation each am for next 4 weeks. Can use albuterol for rescue only up to every 6hrs.  Do not use if you are not having a lot of problems breathing that do not resolve with sitting down. followup with me in 4 weeks.

## 2011-09-30 NOTE — Progress Notes (Signed)
  Subjective:    Patient ID: Marc [name], male    DOB: [birth year], 71 y.o.   MRN: [id number]  HPI The patient is a 71 year old male who I've been asked to see for dyspnea on exertion.  The patient states he was in his usual state of health until May of last year when he began to notice dyspnea with heavier exertional activities.  This became progressively worse since that time.  He is unable to estimate walking distances before he will get winded, but believes it is at least moderate or greater distances.  He denies getting winded bringing groceries in from the car, but will get short of breath walking one flight of stairs.  He has no chronic cough at baseline, and denies lower extremity edema.  He has not had any chest congestion currently nor mucus production.  He was recently put on a prednisone taper for "bronchitis" the patient has had pulmonary function studies last year which shows very mild airflow obstruction, primarily manifested as air trapping.  He was tried on low dose symbicort, and saw no difference.  He is currently on albuterol and thinks this does help.  The patient has had a CT of his chest with no pulmonary emboli, and only emphysematous changes.  He tells me that he has had a CT of his sinuses that was unremarkable as well.  Patient has not had any prior history of asthma.  He does have known coronary disease, but a recent catheter showed stable anatomy.  He has had an echocardiogram which showed a normal left ventricle, but moderately to severely dilated left atrium with mild to moderate mitral regurgitation.  He did not have pulmonary hypertension noted.   Review of Systems  Constitutional: Negative for fever and unexpected weight change.  HENT: Positive for congestion. Negative for ear pain, nosebleeds, sore throat, rhinorrhea, sneezing, trouble swallowing, dental problem, postnasal drip and sinus pressure.   Eyes: Negative for redness and itching.  Respiratory: Positive for  cough and shortness of breath. Negative for chest tightness and wheezing.   Cardiovascular: Negative for palpitations and leg swelling.  Gastrointestinal: Negative for nausea and vomiting.  Genitourinary: Negative for dysuria.  Musculoskeletal: Negative for joint swelling.  Skin: Negative for rash.  Neurological: Negative for headaches.  Hematological: Does not bruise/bleed easily.  Psychiatric/Behavioral: Negative for dysphoric mood. The patient is not nervous/anxious.        Objective:   Physical Exam Constitutional:  Well developed, no acute distress  HENT:  Nares patent without discharge, deviated septum to left with narrowing.   Oropharynx without exudate, palate and uvula are normal  Eyes:  Perrla, eomi, no scleral icterus  Neck:  No JVD, no TMG  Cardiovascular:  irreg rhythm but cvr, no rubs or gallops.  No murmurs        Intact distal pulses  Pulmonary :  Normal breath sounds, no stridor or respiratory distress   No rales, rhonchi, or wheezing  Abdominal:  Soft, nondistended, bowel sounds present.  No tenderness noted.   Musculoskeletal:  No lower extremity edema noted.  Lymph Nodes:  No cervical lymphadenopathy noted  Skin:  No cyanosis noted  Neurologic:  Alert, appropriate, moves all 4 extremities without obvious deficit.         Assessment & Plan:

## 2011-10-28 ENCOUNTER — Ambulatory Visit (INDEPENDENT_AMBULATORY_CARE_PROVIDER_SITE_OTHER): Payer: Medicare Other | Admitting: Pulmonary Disease

## 2011-10-28 ENCOUNTER — Encounter: Payer: Self-pay | Admitting: Pulmonary Disease

## 2011-10-28 VITALS — BP 150/88 | HR 82 | Temp 97.8°F | Ht 73.0 in | Wt 213.0 lb

## 2011-10-28 DIAGNOSIS — J449 Chronic obstructive pulmonary disease, unspecified: Secondary | ICD-10-CM

## 2011-10-28 NOTE — Assessment & Plan Note (Signed)
The patient has seen some improvement with the Spiriva since the last visit, but not a significant change.  I have reminded him that his airflow obstruction is extremely mild, and is only playing a very small role in his overall dyspnea on exertion.  I would like to give him a little more time on the Spiriva since he has seen some progress, and he is to report back to me in about 4 weeks.  We could try the higher dose of symbicort, but he may do just as well with as needed albuterol given his very mild disease.  I've encouraged him to stay as active as possible and to improve his conditioning.

## 2011-10-28 NOTE — Patient Instructions (Signed)
Stay on spiriva each am for one more month.  Please call us in 4 weeks to give followup.  If this is not helping, we can try the higher strength of symbicort, although you may do just as well on albuterol as needed. Stay as active as possible.  followup with me in 6mos.

## 2011-10-28 NOTE — Progress Notes (Signed)
  Subjective:    Patient ID: Marc [name], male    DOB: 05-19-1941, 71 y.o.   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  He was started on Spiriva at the last visit, and has seen some improvement, but not a significant one.  He thinks it has helped his cough and congestion as well.   Review of Systems  Constitutional: Negative for fever and unexpected weight change.  HENT: Negative for ear pain, nosebleeds, congestion, sore throat, rhinorrhea, sneezing, trouble swallowing, dental problem, postnasal drip and sinus pressure.   Eyes: Negative for redness and itching.  Respiratory: Positive for cough, chest tightness, shortness of breath and wheezing.   Cardiovascular: Negative for palpitations and leg swelling.  Gastrointestinal: Negative for nausea and vomiting.  Genitourinary: Negative for dysuria.  Musculoskeletal: Negative for joint swelling.  Skin: Negative for rash.  Neurological: Negative for headaches.  Hematological: Bruises/bleeds easily.  Psychiatric/Behavioral: Negative for dysphoric mood. The patient is not nervous/anxious.        Objective:   Physical Exam Well-developed male in no acute distress Nose without purulence or discharge noted Chest with fairly clear breath sounds, no rhonchi or wheezes Cardiac exam with regular rate and rhythm Lower extremities without edema, no cyanosis noted Alert and oriented, moves all 4 extremities.       Assessment & Plan:

## 2012-04-27 ENCOUNTER — Encounter: Payer: Self-pay | Admitting: Pulmonary Disease

## 2012-04-27 ENCOUNTER — Ambulatory Visit (INDEPENDENT_AMBULATORY_CARE_PROVIDER_SITE_OTHER): Payer: Medicare Other | Admitting: Pulmonary Disease

## 2012-04-27 VITALS — BP 158/72 | HR 70 | Temp 97.9°F | Ht 73.0 in | Wt 207.8 lb

## 2012-04-27 DIAGNOSIS — J449 Chronic obstructive pulmonary disease, unspecified: Secondary | ICD-10-CM

## 2012-04-27 NOTE — Assessment & Plan Note (Signed)
The patient has very mild COPD, and is really not seen any improvement in his symptoms with maintenance bronchodilators.  I think a lot of his dyspnea on exertion is related to probable diastolic dysfunction.  In terms of treatment for his obstructive lung disease, I think as needed albuterol will benefit him just as much as any other medication.  I have also asked him to stay as active as possible.  He does not need ongoing followup with me, but I'm glad to see him if he has any worsening shortness of breath.

## 2012-04-27 NOTE — Progress Notes (Signed)
  Subjective:    Patient ID: Marc [name], male    DOB: 22-Jun-1941, 71 y.o.   MRN: [id number]  HPI Patient comes in today for followup of his mild COPD and dyspnea on exertion.  He did not see any significant improvement in his breathing with symbicort, and therefore was changed to Spiriva as a trial.  He saw some improvement, but not significant.  He feels the Spiriva increase his acid reflux, and therefore discontinued it, and has not seen any change in his breathing.  He denies any significant cough or congestion.   Review of Systems  Constitutional: Negative for fever and unexpected weight change.  HENT: Positive for rhinorrhea and postnasal drip. Negative for ear pain, nosebleeds, congestion, sore throat, sneezing, trouble swallowing, dental problem and sinus pressure.   Eyes: Negative for redness and itching.       Eyes "water" constantly  Respiratory: Negative for cough, chest tightness, shortness of breath and wheezing.   Cardiovascular: Negative for palpitations and leg swelling.  Gastrointestinal: Negative for nausea and vomiting.  Genitourinary: Negative for dysuria.  Musculoskeletal: Negative for joint swelling.  Skin: Negative for rash.  Neurological: Negative for headaches.  Hematological: Does not bruise/bleed easily.  Psychiatric/Behavioral: Negative for dysphoric mood. The patient is not nervous/anxious.        Objective:   Physical Exam Thin male in no acute distress Nose without purulence or discharge noted Chest completely clear to auscultation Cardiac exam with slightly irregular rhythm but controlled ventricular response. Lower extremities without edema, no cyanosis Alert and oriented, moves all 4 extremities.       Assessment & Plan:

## 2012-04-27 NOTE — Patient Instructions (Addendum)
Stay off maintenance medications for your breathing.  They have not really helped Stay on albuterol as needed for significant shortness of breath.  Can use 2 puffs up to every 6hrs if needed. followup with me as needed.

## 2013-09-07 NOTE — Telephone Encounter (Signed)
No other info °

## 2016-01-13 DIAGNOSIS — E782 Mixed hyperlipidemia: Secondary | ICD-10-CM | POA: Insufficient documentation

## 2016-01-13 DIAGNOSIS — N183 Chronic kidney disease, stage 3 unspecified: Secondary | ICD-10-CM | POA: Insufficient documentation

## 2016-01-13 DIAGNOSIS — Z7901 Long term (current) use of anticoagulants: Secondary | ICD-10-CM | POA: Insufficient documentation

## 2016-01-13 DIAGNOSIS — I5042 Chronic combined systolic (congestive) and diastolic (congestive) heart failure: Secondary | ICD-10-CM | POA: Insufficient documentation

## 2016-01-13 DIAGNOSIS — K219 Gastro-esophageal reflux disease without esophagitis: Secondary | ICD-10-CM | POA: Insufficient documentation

## 2016-01-13 DIAGNOSIS — Z79899 Other long term (current) drug therapy: Secondary | ICD-10-CM | POA: Insufficient documentation

## 2016-01-14 DIAGNOSIS — F411 Generalized anxiety disorder: Secondary | ICD-10-CM | POA: Insufficient documentation

## 2016-01-14 DIAGNOSIS — E119 Type 2 diabetes mellitus without complications: Secondary | ICD-10-CM | POA: Insufficient documentation

## 2016-01-14 DIAGNOSIS — Z794 Long term (current) use of insulin: Secondary | ICD-10-CM

## 2016-12-08 DIAGNOSIS — M25561 Pain in right knee: Secondary | ICD-10-CM | POA: Insufficient documentation

## 2016-12-08 DIAGNOSIS — M542 Cervicalgia: Secondary | ICD-10-CM | POA: Insufficient documentation

## 2016-12-08 DIAGNOSIS — M25562 Pain in left knee: Secondary | ICD-10-CM

## 2016-12-21 DIAGNOSIS — Z1389 Encounter for screening for other disorder: Secondary | ICD-10-CM | POA: Diagnosis not present

## 2016-12-21 DIAGNOSIS — M545 Low back pain: Secondary | ICD-10-CM | POA: Diagnosis not present

## 2016-12-21 DIAGNOSIS — I4891 Unspecified atrial fibrillation: Secondary | ICD-10-CM | POA: Diagnosis not present

## 2016-12-21 DIAGNOSIS — Z9181 History of falling: Secondary | ICD-10-CM | POA: Diagnosis not present

## 2016-12-21 DIAGNOSIS — Z7901 Long term (current) use of anticoagulants: Secondary | ICD-10-CM | POA: Diagnosis not present

## 2016-12-21 DIAGNOSIS — R413 Other amnesia: Secondary | ICD-10-CM | POA: Diagnosis not present

## 2016-12-28 DIAGNOSIS — R791 Abnormal coagulation profile: Secondary | ICD-10-CM | POA: Diagnosis not present

## 2016-12-30 DIAGNOSIS — E785 Hyperlipidemia, unspecified: Secondary | ICD-10-CM | POA: Diagnosis not present

## 2016-12-30 DIAGNOSIS — I10 Essential (primary) hypertension: Secondary | ICD-10-CM | POA: Diagnosis not present

## 2016-12-30 DIAGNOSIS — I2510 Atherosclerotic heart disease of native coronary artery without angina pectoris: Secondary | ICD-10-CM | POA: Diagnosis not present

## 2016-12-30 DIAGNOSIS — I482 Chronic atrial fibrillation: Secondary | ICD-10-CM | POA: Diagnosis not present

## 2017-01-05 DIAGNOSIS — R413 Other amnesia: Secondary | ICD-10-CM | POA: Diagnosis not present

## 2017-01-05 DIAGNOSIS — M545 Low back pain: Secondary | ICD-10-CM | POA: Diagnosis not present

## 2017-01-17 DIAGNOSIS — I482 Chronic atrial fibrillation: Secondary | ICD-10-CM | POA: Diagnosis not present

## 2017-01-17 DIAGNOSIS — R262 Difficulty in walking, not elsewhere classified: Secondary | ICD-10-CM | POA: Diagnosis not present

## 2017-02-01 DIAGNOSIS — C44329 Squamous cell carcinoma of skin of other parts of face: Secondary | ICD-10-CM | POA: Diagnosis not present

## 2017-02-08 DIAGNOSIS — Z7901 Long term (current) use of anticoagulants: Secondary | ICD-10-CM | POA: Diagnosis not present

## 2017-02-28 DIAGNOSIS — R791 Abnormal coagulation profile: Secondary | ICD-10-CM | POA: Diagnosis not present

## 2017-03-07 DIAGNOSIS — R791 Abnormal coagulation profile: Secondary | ICD-10-CM | POA: Diagnosis not present

## 2017-03-14 DIAGNOSIS — R791 Abnormal coagulation profile: Secondary | ICD-10-CM | POA: Diagnosis not present

## 2017-03-21 DIAGNOSIS — Z139 Encounter for screening, unspecified: Secondary | ICD-10-CM | POA: Diagnosis not present

## 2017-03-21 DIAGNOSIS — Z791 Long term (current) use of non-steroidal anti-inflammatories (NSAID): Secondary | ICD-10-CM | POA: Diagnosis not present

## 2017-03-21 DIAGNOSIS — Z7901 Long term (current) use of anticoagulants: Secondary | ICD-10-CM | POA: Diagnosis not present

## 2017-03-21 DIAGNOSIS — I482 Chronic atrial fibrillation: Secondary | ICD-10-CM | POA: Diagnosis not present

## 2017-03-21 DIAGNOSIS — R791 Abnormal coagulation profile: Secondary | ICD-10-CM | POA: Diagnosis not present

## 2017-03-21 DIAGNOSIS — I10 Essential (primary) hypertension: Secondary | ICD-10-CM | POA: Diagnosis not present

## 2017-04-21 DIAGNOSIS — I10 Essential (primary) hypertension: Secondary | ICD-10-CM | POA: Diagnosis not present

## 2017-04-21 DIAGNOSIS — I482 Chronic atrial fibrillation: Secondary | ICD-10-CM | POA: Diagnosis not present

## 2017-04-21 DIAGNOSIS — E7800 Pure hypercholesterolemia, unspecified: Secondary | ICD-10-CM | POA: Diagnosis not present

## 2017-04-21 DIAGNOSIS — E119 Type 2 diabetes mellitus without complications: Secondary | ICD-10-CM | POA: Diagnosis not present

## 2017-04-21 DIAGNOSIS — Z7901 Long term (current) use of anticoagulants: Secondary | ICD-10-CM | POA: Diagnosis not present

## 2017-05-23 DIAGNOSIS — Z7901 Long term (current) use of anticoagulants: Secondary | ICD-10-CM | POA: Diagnosis not present

## 2017-06-22 DIAGNOSIS — Z7901 Long term (current) use of anticoagulants: Secondary | ICD-10-CM | POA: Diagnosis not present

## 2017-06-24 DIAGNOSIS — Z2821 Immunization not carried out because of patient refusal: Secondary | ICD-10-CM | POA: Diagnosis not present

## 2017-06-24 DIAGNOSIS — R202 Paresthesia of skin: Secondary | ICD-10-CM | POA: Diagnosis not present

## 2017-07-22 DIAGNOSIS — E7800 Pure hypercholesterolemia, unspecified: Secondary | ICD-10-CM | POA: Diagnosis not present

## 2017-07-22 DIAGNOSIS — Z2821 Immunization not carried out because of patient refusal: Secondary | ICD-10-CM | POA: Diagnosis not present

## 2017-07-22 DIAGNOSIS — E119 Type 2 diabetes mellitus without complications: Secondary | ICD-10-CM | POA: Diagnosis not present

## 2017-07-22 DIAGNOSIS — I10 Essential (primary) hypertension: Secondary | ICD-10-CM | POA: Diagnosis not present

## 2017-07-22 DIAGNOSIS — Z7901 Long term (current) use of anticoagulants: Secondary | ICD-10-CM | POA: Diagnosis not present

## 2017-08-04 DIAGNOSIS — C44311 Basal cell carcinoma of skin of nose: Secondary | ICD-10-CM | POA: Diagnosis not present

## 2017-08-04 DIAGNOSIS — C44529 Squamous cell carcinoma of skin of other part of trunk: Secondary | ICD-10-CM | POA: Diagnosis not present

## 2017-08-04 DIAGNOSIS — L570 Actinic keratosis: Secondary | ICD-10-CM | POA: Diagnosis not present

## 2017-08-23 DIAGNOSIS — Z7901 Long term (current) use of anticoagulants: Secondary | ICD-10-CM | POA: Diagnosis not present

## 2017-08-26 ENCOUNTER — Encounter: Payer: Self-pay | Admitting: Cardiology

## 2017-08-26 ENCOUNTER — Ambulatory Visit (INDEPENDENT_AMBULATORY_CARE_PROVIDER_SITE_OTHER): Payer: Medicare Other | Admitting: Cardiology

## 2017-08-26 VITALS — BP 150/100 | HR 81 | Ht 73.0 in | Wt 197.0 lb

## 2017-08-26 DIAGNOSIS — I2510 Atherosclerotic heart disease of native coronary artery without angina pectoris: Secondary | ICD-10-CM | POA: Diagnosis not present

## 2017-08-26 DIAGNOSIS — I10 Essential (primary) hypertension: Secondary | ICD-10-CM

## 2017-08-26 DIAGNOSIS — J431 Panlobular emphysema: Secondary | ICD-10-CM | POA: Diagnosis not present

## 2017-08-26 DIAGNOSIS — I340 Nonrheumatic mitral (valve) insufficiency: Secondary | ICD-10-CM | POA: Diagnosis not present

## 2017-08-26 DIAGNOSIS — I4891 Unspecified atrial fibrillation: Secondary | ICD-10-CM | POA: Diagnosis not present

## 2017-08-26 NOTE — Patient Instructions (Signed)
Medication Instructions:  Your physician recommends that you continue on your current medications as directed. Please refer to the Current Medication list given to you today.  Labwork: None ordered  Testing/Procedures: None ordered  Follow-Up: Your physician recommends that you schedule a follow-up appointment in: 7 months with Dr. Geraldo Pitter   Any Other Special Instructions Will Be Listed Below (If Applicable).     If you need a refill on your cardiac medications before your next appointment, please call your pharmacy.

## 2017-08-26 NOTE — Progress Notes (Signed)
Cardiology Office Note:    Date:  08/26/2017   ID:  Marc [name], DOB 07-19-1941, MRN [id number]  PCP:  Raina Mina., MD  Cardiologist:  Jenean Lindau, MD   Referring MD: Raina Mina., MD    ASSESSMENT:    1. Atrial fibrillation, unspecified type ([place])   2. Coronary artery disease involving native coronary artery of native heart without angina pectoris   3. Essential hypertension   4. Mitral valve insufficiency, unspecified etiology   5. Panlobular emphysema ([place])    PLAN:    In order of problems listed above:  1. Secondary prevention stressed with the patient.  Importance of compliance with diet and medications stressed.  Importance of regular walking stressed he vocalized understanding. 2. His blood pressure is stable.  Lipids are followed by his primary care physician. 3. His atrial fibrillation is stable. .I discussed with the patient atrial fibrillation, disease process. Management and therapy including rate and rhythm control, anticoagulation benefits and potential risks were discussed extensively with the patient. Patient had multiple questions which were answered to patient's satisfaction.  Newer Anticoagulants were discussed but is not keen on that. 4. 8 months follow-up or earlier if he has any concerns.   Medication Adjustments/Labs and Tests Ordered: Current medicines are reviewed at length with the patient today.  Concerns regarding medicines are outlined above.  Orders Placed This Encounter  Procedures  . EKG 12-Lead   No orders of the defined types were placed in this encounter.    History of Present Illness:    Marc [name] is a 76 y.o. male who is being seen today for the evaluation of atrial fibrillation at the request of Raina Mina., MD.  Patient is a pleasant 76 year old male this patient has been under my care in my previous practice.  He is here now to transfer his care and be established with my current practice.  The patient is here  for follow-up.  He has atrial fibrillation and is on anticoagulation with warfarin.  He tells me that these checks with his primary care physician for pro times are periodic and stable.  He denies any problems at this time.  He lost his wife recently.  No chest pain orthopnea or PND.  He walks on a regular basis but does not exercise.  He leads a sedentary lifestyle.  At the time of my evaluation, the patient is alert awake oriented and in no distress.  Past Medical History:  Diagnosis Date  . AAA (abdominal aortic aneurysm) (HCC)    small. An abdominal ultrasound in 2011 showed no aneurysm  . Arthritis   . Atrial fibrillation ([place])    permanent  . Coronary artery disease   . Hyperlipidemia   . Hypertension   . Mitral regurgitation    with MVP. Mild to moderate  . Renal artery stenosis ([place])   . Renal artery stenosis in 1 of 2 vessels ([place]) 2006   subtotal occlusion of right renal artery. Treated medically.   . Sexual dysfunction     Past Surgical History:  Procedure Laterality Date  . CARDIAC CATHETERIZATION  2006   showed an occluded Left circumflex with collaterals as well as 60% mid right coronary artery stenosis  . CARDIAC CATHETERIZATION  02/2011   LM: 20%, LAD: calcified 20%, LCX: occluded distal AV groove supplying a small area, RCA: calcified with 50-60% proximal-mid disease    Current Medications: Current Meds  Medication Sig  . ALPRAZolam (XANAX) 0.5 MG  tablet TAKE 1/2 TABLET BY MOUTH EVERY 6 HOURS AS NEEDED  . BENAZEPRIL HCL PO Take 20 mg by mouth 2 (two) times daily.    . carvedilol (COREG) 12.5 MG tablet Take 12.5 mg by mouth 2 (two) times daily with a meal.    . diltiazem (CARDIZEM CD) 300 MG 24 hr capsule Take 300 mg by mouth daily.   . fenofibrate 160 MG tablet TAKE 1 TABLET BY MOUTH ONCE DAILY  . fenofibrate micronized (LOFIBRA) 134 MG capsule Take 134 mg by mouth daily before breakfast.  . glipiZIDE (GLUCOTROL XL) 5 MG 24 hr tablet TAKE 1 TABLET BY MOUTH DAILY    . Insulin Glargine (LANTUS SOLOSTAR) 100 UNIT/ML Solostar Pen INJECT 7 UNITS SUB-Q DAILY AT BEDTIME.  . Insulin Pen Needle (NOVOFINE) 32G X 6 MM MISC USE AS DIRECTED.  Marland Kitchen JANTOVEN 4 MG tablet daily.  . nitroGLYCERIN (NITROSTAT) 0.4 MG SL tablet Place 0.4 mg under the tongue.  . pravastatin (PRAVACHOL) 40 MG tablet Take 40 mg by mouth daily.    . sertraline (ZOLOFT) 50 MG tablet daily.  . traMADol-acetaminophen (ULTRACET) 37.5-325 MG per tablet Take 1 tablet by mouth every 6 (six) hours as needed.    . [DISCONTINUED] albuterol (PROAIR HFA) 108 (90 BASE) MCG/ACT inhaler Inhale 2 puffs into the lungs every 6 (six) hours as needed.     Allergies:   Cephalexin; Prednisone; Simvastatin; and Ticagrelor   Social History   Socioeconomic History  . Marital status: Married    Spouse name: None  . Number of children: None  . Years of education: None  . Highest education level: None  Social Needs  . Financial resource strain: None  . Food insecurity - worry: None  . Food insecurity - inability: None  . Transportation needs - medical: None  . Transportation needs - non-medical: None  Occupational History  . Occupation: retired Research scientist (physical sciences): [employer]  Tobacco Use  . Smoking status: Former Smoker    Packs/day: 0.10    Years: 30.00    Pack years: 3.00    Types: Cigarettes    Last attempt to quit: 12/15/1983    Years since quitting: 33.7  . Smokeless tobacco: Former Systems developer    Types: [place] date: 09/07/1983  Substance and Sexual Activity  . Alcohol use: No  . Drug use: No  . Sexual activity: None  Other Topics Concern  . None  Social History Narrative  . None     Family History: The patient's family history is not on file.  ROS:   Please see the history of present illness.    All other systems reviewed and are negative.  EKGs/Labs/Other Studies Reviewed:    The following studies were reviewed today: I reviewed my findings with the patient at extensive length.  EKG  reveals patient with well-controlled ventricular rate.   Recent Labs: No results found for requested labs within last 8760 hours.  Recent Lipid Panel No results found for: CHOL, TRIG, HDL, CHOLHDL, VLDL, LDLCALC, LDLDIRECT  Physical Exam:    VS:  BP (!) 150/100 (BP Location: Right Arm, Patient Position: Sitting, Cuff Size: Normal)   Pulse 81   Ht 6\' 1"  (1.854 m)   Wt 197 lb (89.4 kg)   SpO2 98%   BMI 25.99 kg/m     Wt Readings from Last 3 Encounters:  08/26/17 197 lb (89.4 kg)  04/27/12 207 lb 12.8 oz (94.3 kg)  10/28/11 213 lb (96.6 kg)  GEN: Patient is in no acute distress HEENT: Normal NECK: No JVD; No carotid bruits LYMPHATICS: No lymphadenopathy CARDIAC: S1 S2 irregular, 2/6 systolic murmur at the apex. RESPIRATORY:  Clear to auscultation without rales, wheezing or rhonchi  ABDOMEN: Soft, non-tender, non-distended MUSCULOSKELETAL:  No edema; No deformity  SKIN: Warm and dry NEUROLOGIC:  Alert and oriented x 3 PSYCHIATRIC:  Normal affect    Signed, Jenean Lindau, MD  08/26/2017 4:45 PM    [facility]

## 2017-09-06 HISTORY — PX: COLONOSCOPY: SHX174

## 2017-09-07 DIAGNOSIS — Z125 Encounter for screening for malignant neoplasm of prostate: Secondary | ICD-10-CM | POA: Diagnosis not present

## 2017-09-07 DIAGNOSIS — E785 Hyperlipidemia, unspecified: Secondary | ICD-10-CM | POA: Diagnosis not present

## 2017-09-07 DIAGNOSIS — Z7901 Long term (current) use of anticoagulants: Secondary | ICD-10-CM | POA: Diagnosis not present

## 2017-09-07 DIAGNOSIS — Z9181 History of falling: Secondary | ICD-10-CM | POA: Diagnosis not present

## 2017-09-07 DIAGNOSIS — Z0000 Encounter for general adult medical examination without abnormal findings: Secondary | ICD-10-CM | POA: Diagnosis not present

## 2017-09-07 DIAGNOSIS — Z1331 Encounter for screening for depression: Secondary | ICD-10-CM | POA: Diagnosis not present

## 2017-10-10 DIAGNOSIS — R310 Gross hematuria: Secondary | ICD-10-CM | POA: Diagnosis not present

## 2017-10-10 DIAGNOSIS — Z7901 Long term (current) use of anticoagulants: Secondary | ICD-10-CM | POA: Diagnosis not present

## 2017-10-12 DIAGNOSIS — R791 Abnormal coagulation profile: Secondary | ICD-10-CM | POA: Diagnosis not present

## 2017-10-12 DIAGNOSIS — R310 Gross hematuria: Secondary | ICD-10-CM | POA: Diagnosis not present

## 2017-10-13 DIAGNOSIS — N401 Enlarged prostate with lower urinary tract symptoms: Secondary | ICD-10-CM | POA: Diagnosis not present

## 2017-10-13 DIAGNOSIS — Z79899 Other long term (current) drug therapy: Secondary | ICD-10-CM | POA: Diagnosis not present

## 2017-10-13 DIAGNOSIS — Z7901 Long term (current) use of anticoagulants: Secondary | ICD-10-CM | POA: Diagnosis not present

## 2017-10-13 DIAGNOSIS — R310 Gross hematuria: Secondary | ICD-10-CM | POA: Diagnosis not present

## 2017-10-14 DIAGNOSIS — R791 Abnormal coagulation profile: Secondary | ICD-10-CM | POA: Diagnosis not present

## 2017-10-21 DIAGNOSIS — N200 Calculus of kidney: Secondary | ICD-10-CM | POA: Diagnosis not present

## 2017-10-21 DIAGNOSIS — R310 Gross hematuria: Secondary | ICD-10-CM | POA: Diagnosis not present

## 2017-10-25 ENCOUNTER — Encounter: Payer: Self-pay | Admitting: Cardiology

## 2017-10-25 ENCOUNTER — Other Ambulatory Visit: Payer: Self-pay

## 2017-10-25 ENCOUNTER — Ambulatory Visit (INDEPENDENT_AMBULATORY_CARE_PROVIDER_SITE_OTHER): Payer: Medicare Other | Admitting: Cardiology

## 2017-10-25 VITALS — BP 148/70 | HR 71 | Ht 73.0 in | Wt 197.0 lb

## 2017-10-25 DIAGNOSIS — Z7901 Long term (current) use of anticoagulants: Secondary | ICD-10-CM

## 2017-10-25 DIAGNOSIS — Z794 Long term (current) use of insulin: Secondary | ICD-10-CM | POA: Diagnosis not present

## 2017-10-25 DIAGNOSIS — I2510 Atherosclerotic heart disease of native coronary artery without angina pectoris: Secondary | ICD-10-CM

## 2017-10-25 DIAGNOSIS — I10 Essential (primary) hypertension: Secondary | ICD-10-CM

## 2017-10-25 DIAGNOSIS — E119 Type 2 diabetes mellitus without complications: Secondary | ICD-10-CM | POA: Diagnosis not present

## 2017-10-25 DIAGNOSIS — E782 Mixed hyperlipidemia: Secondary | ICD-10-CM

## 2017-10-25 DIAGNOSIS — I480 Paroxysmal atrial fibrillation: Secondary | ICD-10-CM

## 2017-10-25 DIAGNOSIS — I340 Nonrheumatic mitral (valve) insufficiency: Secondary | ICD-10-CM | POA: Diagnosis not present

## 2017-10-25 NOTE — Patient Instructions (Signed)

## 2017-10-25 NOTE — Progress Notes (Signed)
Cardiology Office Note:    Date:  10/25/2017   ID:  [name], DOB [birth year], MRN [id number]  PCP:  Raina Mina., MD  Cardiologist:  Jenean Lindau, MD   Referring MD: Raina Mina., MD    ASSESSMENT:    1. Paroxysmal atrial fibrillation (HCC)   2. Coronary artery disease involving native coronary artery of native heart without angina pectoris   3. Essential hypertension   4. Mitral valve insufficiency, unspecified etiology   5. Type 2 diabetes mellitus without complication, with long-term current use of insulin ([place])   6. Long term current use of anticoagulant therapy   7. Mixed hyperlipidemia    PLAN:    In order of problems listed above:  1. Secondary prevention stressed with the patient.  Importance of compliance with diet and medications stressed and he vocalized understanding.  He leads a sedentary lifestyle.  Importance of exercise stressed and he plans to exercise on a regular basis.  He is planning to undergo blood work in the next few days and his daughter will send it to me. 2. I will try to obtain a report of coronary angiography done at [facility]. 3. Blood pressure is elevated and we will have a blood pressure check done by his daughter and she will fax me the reports. 4. CT scan revealed aortic calcifications and again aggressive secondary prevention was stressed. 5. Patient will be seen in follow-up appointment in 6 months or earlier if the patient has any concerns    Medication Adjustments/Labs and Tests Ordered: Current medicines are reviewed at length with the patient today.  Concerns regarding medicines are outlined above.  No orders of the defined types were placed in this encounter.  No orders of the defined types were placed in this encounter.    Chief Complaint  Patient presents with  . Follow-up  . Atrial Fibrillation     History of Present Illness:    JEFFRIE LOFSTROM is a 77 y.o. male.  Patient has known  coronary artery disease in approximately fibrillation.  He denies any problems at this time and takes care of activities of daily living.  No chest pain orthopnea or PND.  He has approximately fibrillation.  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  He mentions to me that his pro time was elevated the last time.  His medication was switched from warfarin to Xarelto and he is tolerating it well.  His daughter accompanies him.  No chest pain orthopnea or PND.  At the time of my evaluation, the patient is alert awake oriented and in no distress.  Past Medical History:  Diagnosis Date  . AAA (abdominal aortic aneurysm) (HCC)    small. An abdominal ultrasound in 2011 showed no aneurysm  . Arthritis   . Atrial fibrillation ([place])    permanent  . Coronary artery disease   . Hyperlipidemia   . Hypertension   . Mitral regurgitation    with MVP. Mild to moderate  . Renal artery stenosis ([place])   . Renal artery stenosis in 1 of 2 vessels ([place]) 2006   subtotal occlusion of right renal artery. Treated medically.   . Sexual dysfunction     Past Surgical History:  Procedure Laterality Date  . CARDIAC CATHETERIZATION  2006   showed an occluded Left circumflex with collaterals as well as 60% mid right coronary artery stenosis  . CARDIAC CATHETERIZATION  02/2011  LM: 20%, LAD: calcified 20%, LCX: occluded distal AV groove supplying a small area, RCA: calcified with 50-60% proximal-mid disease    Current Medications: Current Meds  Medication Sig  . ALPRAZolam (XANAX) 0.5 MG tablet TAKE 1/2 TABLET BY MOUTH EVERY 6 HOURS AS NEEDED  . BENAZEPRIL HCL PO Take 20 mg by mouth 2 (two) times daily.    . carvedilol (COREG) 12.5 MG tablet Take 12.5 mg by mouth 2 (two) times daily with a meal.    . ciprofloxacin (CIPRO) 500 MG tablet   . diltiazem (CARDIZEM CD) 300 MG 24 hr capsule Take 300 mg by mouth daily.   . fenofibrate 160 MG tablet TAKE 1 TABLET BY MOUTH  ONCE DAILY  . fenofibrate micronized (LOFIBRA) 134 MG capsule Take 134 mg by mouth daily before breakfast.  . glipiZIDE (GLUCOTROL XL) 5 MG 24 hr tablet TAKE 1 TABLET BY MOUTH DAILY  . Insulin Glargine (LANTUS SOLOSTAR) 100 UNIT/ML Solostar Pen INJECT 7 UNITS SUB-Q DAILY AT BEDTIME.  . Insulin Pen Needle (NOVOFINE) 32G X 6 MM MISC USE AS DIRECTED.  Marland Kitchen JANTOVEN 4 MG tablet daily.  . nitroGLYCERIN (NITROSTAT) 0.4 MG SL tablet Place 0.4 mg under the tongue.  . pravastatin (PRAVACHOL) 40 MG tablet Take 40 mg by mouth daily.    . sertraline (ZOLOFT) 50 MG tablet daily.  . tamsulosin (FLOMAX) 0.4 MG CAPS capsule   . traMADol-acetaminophen (ULTRACET) 37.5-325 MG per tablet Take 1 tablet by mouth every 6 (six) hours as needed.       Allergies:   Cephalexin; Prednisone; Simvastatin; and Ticagrelor   Social History   Socioeconomic History  . Marital status: Married    Spouse name: None  . Number of children: None  . Years of education: None  . Highest education level: None  Social Needs  . Financial resource strain: None  . Food insecurity - worry: None  . Food insecurity - inability: None  . Transportation needs - medical: None  . Transportation needs - non-medical: None  Occupational History  . Occupation: retired Research scientist (physical sciences): [employer]  Tobacco Use  . Smoking status: Former Smoker    Packs/day: 0.10    Years: 30.00    Pack years: 3.00    Types: Cigarettes    Last attempt to quit: 12/15/1983    Years since quitting: 33.8  . Smokeless tobacco: Former Systems developer    Types: [place] date: 09/07/1983  Substance and Sexual Activity  . Alcohol use: No  . Drug use: No  . Sexual activity: None  Other Topics Concern  . None  Social History Narrative  . None     Family History: The patient's family history is not on file.  ROS:   Please see the history of present illness.    All other systems reviewed and are negative.  EKGs/Labs/Other Studies Reviewed:    The following  studies were reviewed today: I discussed the CT scan report with the patient at extensive length.   Recent Labs: No results found for requested labs within last 8760 hours.  Recent Lipid Panel No results found for: CHOL, TRIG, HDL, CHOLHDL, VLDL, LDLCALC, LDLDIRECT  Physical Exam:    VS:  BP (!) 148/70 (BP Location: Right Arm, Patient Position: Sitting, Cuff Size: Normal)   Pulse 71   Ht 6\' 1"  (1.854 m)   Wt 197 lb (89.4 kg)   SpO2 98%   BMI 25.99 kg/m     Wt Readings from  Last 3 Encounters:  10/25/17 197 lb (89.4 kg)  08/26/17 197 lb (89.4 kg)  04/27/12 207 lb 12.8 oz (94.3 kg)     GEN: Patient is in no acute distress HEENT: Normal NECK: No JVD; No carotid bruits LYMPHATICS: No lymphadenopathy CARDIAC: Hear sounds regular, 2/6 systolic murmur at the apex. RESPIRATORY:  Clear to auscultation without rales, wheezing or rhonchi  ABDOMEN: Soft, non-tender, non-distended MUSCULOSKELETAL:  No edema; No deformity  SKIN: Warm and dry NEUROLOGIC:  Alert and oriented x 3 PSYCHIATRIC:  Normal affect   Signed, Jenean Lindau, MD  10/25/2017 4:25 PM    [facility]

## 2017-10-31 DIAGNOSIS — N289 Disorder of kidney and ureter, unspecified: Secondary | ICD-10-CM | POA: Diagnosis not present

## 2017-10-31 DIAGNOSIS — R93429 Abnormal radiologic findings on diagnostic imaging of unspecified kidney: Secondary | ICD-10-CM | POA: Diagnosis not present

## 2017-11-03 DIAGNOSIS — Z125 Encounter for screening for malignant neoplasm of prostate: Secondary | ICD-10-CM | POA: Diagnosis not present

## 2017-11-03 DIAGNOSIS — E119 Type 2 diabetes mellitus without complications: Secondary | ICD-10-CM | POA: Diagnosis not present

## 2017-11-03 DIAGNOSIS — E7800 Pure hypercholesterolemia, unspecified: Secondary | ICD-10-CM | POA: Diagnosis not present

## 2017-11-03 DIAGNOSIS — J449 Chronic obstructive pulmonary disease, unspecified: Secondary | ICD-10-CM | POA: Diagnosis not present

## 2017-11-10 DIAGNOSIS — N281 Cyst of kidney, acquired: Secondary | ICD-10-CM | POA: Diagnosis not present

## 2017-11-10 DIAGNOSIS — R310 Gross hematuria: Secondary | ICD-10-CM | POA: Diagnosis not present

## 2017-11-10 DIAGNOSIS — N401 Enlarged prostate with lower urinary tract symptoms: Secondary | ICD-10-CM | POA: Diagnosis not present

## 2018-01-03 ENCOUNTER — Inpatient Hospital Stay (HOSPITAL_COMMUNITY)
Admission: AD | Admit: 2018-01-03 | Discharge: 2018-01-06 | DRG: 281 | Disposition: A | Discharge status: Home / Self Care | Payer: Medicare Other | Source: Other Acute Inpatient Hospital | Attending: Cardiology | Admitting: Cardiology

## 2018-01-03 DIAGNOSIS — Z7901 Long term (current) use of anticoagulants: Secondary | ICD-10-CM | POA: Diagnosis not present

## 2018-01-03 DIAGNOSIS — D509 Iron deficiency anemia, unspecified: Secondary | ICD-10-CM | POA: Diagnosis present

## 2018-01-03 DIAGNOSIS — Z955 Presence of coronary angioplasty implant and graft: Secondary | ICD-10-CM | POA: Diagnosis not present

## 2018-01-03 DIAGNOSIS — R319 Hematuria, unspecified: Secondary | ICD-10-CM | POA: Diagnosis present

## 2018-01-03 DIAGNOSIS — I25110 Atherosclerotic heart disease of native coronary artery with unstable angina pectoris: Secondary | ICD-10-CM | POA: Diagnosis not present

## 2018-01-03 DIAGNOSIS — I340 Nonrheumatic mitral (valve) insufficiency: Secondary | ICD-10-CM | POA: Diagnosis present

## 2018-01-03 DIAGNOSIS — I249 Acute ischemic heart disease, unspecified: Secondary | ICD-10-CM | POA: Diagnosis not present

## 2018-01-03 DIAGNOSIS — E119 Type 2 diabetes mellitus without complications: Secondary | ICD-10-CM | POA: Diagnosis present

## 2018-01-03 DIAGNOSIS — I214 Non-ST elevation (NSTEMI) myocardial infarction: Secondary | ICD-10-CM | POA: Diagnosis present

## 2018-01-03 DIAGNOSIS — Z79899 Other long term (current) drug therapy: Secondary | ICD-10-CM

## 2018-01-03 DIAGNOSIS — Z87891 Personal history of nicotine dependence: Secondary | ICD-10-CM | POA: Diagnosis not present

## 2018-01-03 DIAGNOSIS — E782 Mixed hyperlipidemia: Secondary | ICD-10-CM | POA: Diagnosis not present

## 2018-01-03 DIAGNOSIS — I482 Chronic atrial fibrillation: Secondary | ICD-10-CM | POA: Diagnosis present

## 2018-01-03 DIAGNOSIS — I2510 Atherosclerotic heart disease of native coronary artery without angina pectoris: Secondary | ICD-10-CM | POA: Diagnosis present

## 2018-01-03 DIAGNOSIS — I10 Essential (primary) hypertension: Secondary | ICD-10-CM | POA: Diagnosis present

## 2018-01-03 DIAGNOSIS — E785 Hyperlipidemia, unspecified: Secondary | ICD-10-CM | POA: Diagnosis present

## 2018-01-03 DIAGNOSIS — Z794 Long term (current) use of insulin: Secondary | ICD-10-CM | POA: Diagnosis not present

## 2018-01-03 DIAGNOSIS — I429 Cardiomyopathy, unspecified: Secondary | ICD-10-CM | POA: Diagnosis present

## 2018-01-03 HISTORY — DX: Acute ischemic heart disease, unspecified: I24.9

## 2018-01-03 HISTORY — DX: Type 2 diabetes mellitus without complications: E11.9

## 2018-01-03 LAB — HEMOGLOBIN A1C
Hgb A1c MFr Bld: 6.4 % — ABNORMAL HIGH (ref 4.8–5.6)
Mean Plasma Glucose: 136.98 mg/dL

## 2018-01-03 LAB — GLUCOSE, CAPILLARY: Glucose-Capillary: 117 mg/dL — ABNORMAL HIGH (ref 65–99)

## 2018-01-03 MED ORDER — ALPRAZOLAM 0.25 MG PO TABS: 0.2500 mg | ORAL_TABLET | Freq: Every evening | ORAL | Status: DC | PRN

## 2018-01-03 MED ORDER — INSULIN ASPART 100 UNIT/ML ~~LOC~~ SOLN
0.0000 [IU] | Freq: Three times a day (TID) | SUBCUTANEOUS | Status: DC
  Administered 2018-01-04: 17:00:00 1 [IU] via SUBCUTANEOUS
  Administered 2018-01-04: 2 [IU] via SUBCUTANEOUS
  Administered 2018-01-04: 1 [IU] via SUBCUTANEOUS
  Administered 2018-01-05: 07:00:00 2 [IU] via SUBCUTANEOUS
  Administered 2018-01-05 – 2018-01-06 (×2): 1 [IU] via SUBCUTANEOUS

## 2018-01-03 MED ORDER — DILTIAZEM HCL ER COATED BEADS 300 MG PO CP24
300.0000 mg | ORAL_CAPSULE | Freq: Every day | ORAL | Status: DC
  Administered 2018-01-03 – 2018-01-06 (×4): 300 mg via ORAL
  Filled 2018-01-03 (×4): qty 1

## 2018-01-03 MED ORDER — BENAZEPRIL HCL 20 MG PO TABS
20.0000 mg | ORAL_TABLET | Freq: Two times a day (BID) | ORAL | Status: DC
  Administered 2018-01-03 – 2018-01-06 (×6): 20 mg via ORAL
  Filled 2018-01-03 (×6): qty 1

## 2018-01-03 MED ORDER — CARVEDILOL 12.5 MG PO TABS
12.5000 mg | ORAL_TABLET | Freq: Two times a day (BID) | ORAL | Status: DC
  Administered 2018-01-03 – 2018-01-06 (×6): 12.5 mg via ORAL
  Filled 2018-01-03 (×6): qty 1

## 2018-01-03 MED ORDER — INSULIN GLARGINE 100 UNIT/ML ~~LOC~~ SOLN
5.0000 [IU] | Freq: Every day | SUBCUTANEOUS | Status: DC
  Administered 2018-01-03 – 2018-01-05 (×3): 5 [IU] via SUBCUTANEOUS
  Filled 2018-01-03 (×3): qty 0.05

## 2018-01-03 MED ORDER — ACETAMINOPHEN 325 MG PO TABS
650.0000 mg | ORAL_TABLET | ORAL | Status: DC | PRN
  Administered 2018-01-05: 650 mg via ORAL
  Filled 2018-01-03: qty 2

## 2018-01-03 MED ORDER — ASPIRIN EC 81 MG PO TBEC
81.0000 mg | DELAYED_RELEASE_TABLET | Freq: Every day | ORAL | Status: DC
  Administered 2018-01-05 – 2018-01-06 (×2): 81 mg via ORAL
  Filled 2018-01-03 (×3): qty 1

## 2018-01-03 MED ORDER — NITROGLYCERIN 0.4 MG SL SUBL: 0.4000 mg | SUBLINGUAL_TABLET | SUBLINGUAL | Status: DC | PRN

## 2018-01-03 MED ORDER — TRAMADOL-ACETAMINOPHEN 37.5-325 MG PO TABS: 1.0000 | ORAL_TABLET | Freq: Four times a day (QID) | ORAL | Status: DC | PRN

## 2018-01-03 MED ORDER — TAMSULOSIN HCL 0.4 MG PO CAPS
0.4000 mg | ORAL_CAPSULE | Freq: Every day | ORAL | Status: DC
  Administered 2018-01-04 – 2018-01-05 (×2): 0.4 mg via ORAL
  Filled 2018-01-03 (×2): qty 1

## 2018-01-03 MED ORDER — PRAVASTATIN SODIUM 40 MG PO TABS
40.0000 mg | ORAL_TABLET | Freq: Every day | ORAL | Status: DC
  Administered 2018-01-03 – 2018-01-04 (×2): 40 mg via ORAL
  Filled 2018-01-03 (×2): qty 1

## 2018-01-03 MED ORDER — ONDANSETRON HCL 4 MG/2ML IJ SOLN: 4.0000 mg | Freq: Four times a day (QID) | INTRAMUSCULAR | Status: DC | PRN

## 2018-01-03 MED ORDER — HEPARIN (PORCINE) IN NACL 100-0.45 UNIT/ML-% IJ SOLN
1400.0000 [IU]/h | INTRAMUSCULAR | Status: DC
  Administered 2018-01-04: 1400 [IU]/h via INTRAVENOUS
  Filled 2018-01-03: qty 250

## 2018-01-03 MED ORDER — INSULIN ASPART 100 UNIT/ML ~~LOC~~ SOLN: 0.0000 [IU] | Freq: Every day | SUBCUTANEOUS | Status: DC

## 2018-01-03 NOTE — H&P (Signed)
History & Physical    Patient ID: Marc [name] MRN: [id number], DOB/AGE: 11/09/1940   Admit date: 01/03/2018   Primary Physician: Raina Mina., MD Primary Cardiologist: No primary care provider on file.  Patient Profile    [age]-year-old man admitted for an NSTEMI  Past Medical History    Past Medical History:  Diagnosis Date  . AAA (abdominal aortic aneurysm) (HCC)    small. An abdominal ultrasound in 2011 showed no aneurysm  . Arthritis   . Atrial fibrillation ([place])    permanent  . Coronary artery disease   . Hyperlipidemia   . Hypertension   . Mitral regurgitation    with MVP. Mild to moderate  . Renal artery stenosis ([place])   . Renal artery stenosis in 1 of 2 vessels ([place]) 2006   subtotal occlusion of right renal artery. Treated medically.   . Sexual dysfunction     Past Surgical History:  Procedure Laterality Date  . CARDIAC CATHETERIZATION  2006   showed an occluded Left circumflex with collaterals as well as 60% mid right coronary artery stenosis  . CARDIAC CATHETERIZATION  02/2011   LM: 20%, LAD: calcified 20%, LCX: occluded distal AV groove supplying a small area, RCA: calcified with 50-60% proximal-mid disease     Allergies  Allergies  Allergen Reactions  . Ticagrelor Shortness Of Breath and Other (See Comments)    * Brilinta* Pt short of breath  . Cephalexin Diarrhea    vomitting vomitting   . Prednisone Other (See Comments)    GI Issues  . Simvastatin Other (See Comments)    Unsure    History of Present Illness    Marc [name] has a past medical history of CAD status post PCI few years ago at [facility].  Diabetes and high blood pressure.  And permanent atrial fibrillation rate controlled.  She reports new onset of retrosternal chest pain and sensation of heartburn starting at 10 AM this morning.  He seek to have an appointment with his primary care physician but then decided to go to the emergency room at [facility].  At the hospital he  was found to have an unremarkable ECG but his initial troponin was 0.5 and subsequently increased to 5.  Patient's was given aspirin, heparin, carvedilol.  Patient's pain subsided while in the ED at [facility].  Patient was arranged for transfer to [facility] ED.  Patient denies shortness of breath, chest pain at this time point, PND, orthopnea, dizziness.  Was recently started on rivaroxaban 2 weeks ago.  He thinks that rivaroxaban might have caused him some of his current complaints.  He was given warfarin but had blood in his urine and was transitioned to rivaroxaban  Home Medications    Prior to Admission medications   Medication Sig Start Date End Date Taking? Authorizing Provider  ALPRAZolam (XANAX) 0.5 MG tablet TAKE 1/2 TABLET BY MOUTH EVERY 6 HOURS AS NEEDED FOR ANXIETY 12/07/16   [provider]  benazepril (LOTENSIN) 20 MG tablet Take 20 mg by mouth 2 (two) times daily.      [provider]  carvedilol (COREG) 12.5 MG tablet Take 12.5 mg by mouth 2 (two) times daily with a meal.      [provider]  diltiazem (CARDIZEM CD) 300 MG 24 hr capsule Take 300 mg by mouth daily.     [provider]  glipiZIDE (GLUCOTROL XL) 5 MG 24 hr tablet TAKE 1 TABLET BY MOUTH DAILY 07/18/17  [provider]  Insulin Glargine (LANTUS SOLOSTAR) 100 UNIT/ML Solostar Pen INJECT 10 UNITS SUB-Q DAILY AT BEDTIME. 01/10/17   [provider]  Insulin Pen Needle (NOVOFINE) 32G X 6 MM MISC USE AS DIRECTED. 10/20/16   [provider]  Lactobacillus (FLORAJEN ACIDOPHILUS PO) Take 1 capsule by mouth daily.    [provider]  nitroGLYCERIN (NITROSTAT) 0.4 MG SL tablet Place 0.4 mg under the tongue every 5 (five) minutes as needed for chest pain.     [provider]  pravastatin (PRAVACHOL) 40 MG tablet Take 40 mg by mouth at bedtime.     [provider]  Rivaroxaban (XARELTO) 15 MG TABS tablet Take 15 mg by mouth daily with  supper.    [provider]  tamsulosin (FLOMAX) 0.4 MG CAPS capsule Take 0.4 mg by mouth daily after supper.  10/13/17   [provider]  traMADol-acetaminophen (ULTRACET) 37.5-325 MG per tablet Take 1 tablet by mouth every 6 (six) hours as needed for moderate pain.     [provider]    Family History    No family history on file. indicated that his mother is deceased. He indicated that his father is deceased. He indicated that both of his sisters are deceased.   Social History    Social History   Socioeconomic History  . Marital status: Married    Spouse name: Not on file  . Number of children: Not on file  . Years of education: Not on file  . Highest education level: Not on file  Occupational History  . 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 Financial resource strain: Not on file  . Food insecurity:    Worry: Not on file    Inability: Not on file  . Transportation needs:    Medical: Not on file    Non-medical: Not on file  Tobacco Use  . Smoking status: Former Smoker    Packs/day: 0.10    Years: 30.00    Pack years: 3.00    Types: Cigarettes    Last attempt to quit: 12/15/1983    Years since quitting: 34.0  . Smokeless tobacco: Former Systems developer    Types: [place] date: 09/07/1983  Substance and Sexual Activity  . Alcohol use: No  . Drug use: No  . Sexual activity: Not on file  Lifestyle  . Physical activity:    Days per week: Not on file    Minutes per session: Not on file  . Stress: Not on file  Relationships  . Social connections:    Talks on phone: Not on file    Gets together: Not on file    Attends religious service: Not on file    Active member of club or organization: Not on file    Attends meetings of clubs or organizations: Not on file    Relationship status: Not on file  . Intimate partner violence:    Fear of current or ex partner: Not on file    Emotionally abused: Not on file     Physically abused: Not on file    Forced sexual activity: Not on file  Other Topics Concern  . Not on file  Social History Narrative  . Not on file     Review of Systems    General:  No chills, fever, night sweats or weight changes.  Cardiovascular:  No chest pain, dyspnea on exertion, edema, orthopnea, palpitations, paroxysmal nocturnal  dyspnea. Dermatological: No rash, lesions/masses Respiratory: No cough, dyspnea Urologic: No hematuria, dysuria Abdominal:   No nausea, vomiting, diarrhea, bright red blood per rectum, melena, or hematemesis Neurologic:  No visual changes, wkns, changes in mental status. All other systems reviewed and are otherwise negative except as noted above.  Physical Exam    Blood pressure (!) 174/90, pulse 85, temperature 98.4 F (36.9 C), temperature source Oral, resp. rate 14, height (!) 6" (0.152 m), weight 88.4 kg (194 lb 14.2 oz), SpO2 99 %.  General: Pleasant, NAD Psych: Normal affect. Neuro: Alert and oriented X 3. Moves all extremities spontaneously. HEENT: Normal  Neck: Supple without bruits or JVD. Lungs:  Resp regular and unlabored, CTA. Heart: RRR no s3, s4, or murmurs. Abdomen: Soft, non-tender, non-distended, BS + x 4.  Extremities: No clubbing, cyanosis or edema. DP/PT/Radials 2+ and equal bilaterally.  Labs    Troponin (Point of Care Test) No results for input(s): TROPIPOC in the last 72 hours. No results for input(s): CKTOTAL, CKMB, TROPONINI in the last 72 hours. Lab Results  Component Value Date   WBC 4.4 02/24/2011   HGB 14.6 02/24/2011   HCT 42.6 02/24/2011   MCV 88.4 02/24/2011   PLT 144 (L) 02/24/2011   No results for input(s): NA, K, CL, CO2, BUN, CREATININE, CALCIUM, PROT, BILITOT, ALKPHOS, ALT, AST, GLUCOSE in the last 168 hours.  Invalid input(s): LABALBU No results found for: CHOL, HDL, LDLCALC, TRIG No results found for: [facility]   Radiology Studies    No results found.  ECG & Cardiac Imaging    A. fib rate  controlled no acute ischemic changes  Assessment & Plan    ACS: Patient presents with an NSTEMI is currently chest pain-free.  Troponin of 5, asymptomatic, unremarkable ECG besides atrial fibrillation.  Known CAD status post 1 stent.  We will keep n.p.o. for left heart cath tomorrow.  We will get hemoglobin A1c, trend troponins, trend ECG, get TTE in the morning.  ATrail fibrillation: [place] vas score of 4.   on rivaroxaban at home.  And last dose this morning.  Here on heparin and aspirin.  Will hold rivaroxaban.  Diabetes: We will start sliding scale insulin and hold home glipizide.  Signed, Cristina Gong, MD 01/03/2018, 9:58 PM

## 2018-01-03 NOTE — Progress Notes (Signed)
ANTICOAGULATION CONSULT NOTE - Initial Consult  Pharmacy Consult for NSTEMI Indication: chest pain/ACS  Allergies  Allergen Reactions  . Ticagrelor Shortness Of Breath and Other (See Comments)    * Brilinta* Pt short of breath  . Cephalexin Diarrhea    vomitting vomitting   . Lisinopril     Difficulty breathing   . Prednisone Other (See Comments)    GI Issues  . Simvastatin Other (See Comments)    Unsure    Patient Measurements: Height: (!) 6" (15.2 cm) Weight: 194 lb 14.2 oz (88.4 kg) IBW/kg (Calculated) : -74.2 Heparin Dosing Weight:    Vital Signs: Temp: 98.4 F (36.9 C) (04/30 2148) Temp Source: Oral (04/30 2148) BP: 181/92 (04/30 2200) Pulse Rate: 70 (04/30 2200)  Labs: No results for input(s): HGB, HCT, PLT, APTT, LABPROT, INR, HEPARINUNFRC, HEPRLOWMOCWT, CREATININE, CKTOTAL, CKMB, TROPONINI in the last 72 hours.  CrCl cannot be calculated (Patient's most recent lab result is older than the maximum 21 days allowed.).   Medical History: Past Medical History:  Diagnosis Date  . AAA (abdominal aortic aneurysm) (HCC)    small. An abdominal ultrasound in 2011 showed no aneurysm  . Arthritis   . Atrial fibrillation ([place])    permanent  . Coronary artery disease   . Hyperlipidemia   . Hypertension   . Mitral regurgitation    with MVP. Mild to moderate  . Renal artery stenosis ([place])   . 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 Treated medically.   . Sexual dysfunction     Assessment:  CC/HPI: NSTEMI: Pt from [facility], heparin running at 1400 units/hr since 1900 per MAR (no bolus noted). Pt on Xarelto PTA - WBC 5.1, H/H 14.4/41.8, Plts 188 - PT/INR: 11.4/1.1 - K=4, Scr 1.1 - proBNP 1010  PMH: CAD, CHF, MR, HTN, HLD, afib, DM, COPD, emphysema, single kidney, h/o PUD, arthritis,   Anticoag: NSTEMI from [place]. Xarelto PTA for h/o afib (pt said last dose 4/28).  Goal of Therapy:  Heparin level  0.3-0.7 units/ml  apTT 66-102 Monitor platelets by anticoagulation protocol: Yes   Plan:  Check heparin level and aPTT  Continue IV heparin at 1400 units/hr Daily HL and CBC  Adasyn Mcadams S. Alford [place], PharmD, BCPS Clinical Staff Pharmacist Pager [phone]  Eilene Ghazi Stillinger 01/03/2018,10:29 PM

## 2018-01-04 ENCOUNTER — Ambulatory Visit (HOSPITAL_COMMUNITY): Admission: RE | Admit: 2018-01-04 | Payer: Medicare Other | Source: Ambulatory Visit | Admitting: Cardiovascular Disease

## 2018-01-04 ENCOUNTER — Other Ambulatory Visit (HOSPITAL_COMMUNITY): Payer: Medicare Other

## 2018-01-04 ENCOUNTER — Inpatient Hospital Stay (HOSPITAL_COMMUNITY)
Admission: AD | Disposition: A | Discharge status: Home / Self Care | Payer: Self-pay | Source: Other Acute Inpatient Hospital | Attending: Cardiology

## 2018-01-04 ENCOUNTER — Other Ambulatory Visit: Payer: Self-pay

## 2018-01-04 ENCOUNTER — Encounter (HOSPITAL_COMMUNITY): Payer: Self-pay | Admitting: General Practice

## 2018-01-04 DIAGNOSIS — I25110 Atherosclerotic heart disease of native coronary artery with unstable angina pectoris: Secondary | ICD-10-CM

## 2018-01-04 DIAGNOSIS — I2510 Atherosclerotic heart disease of native coronary artery without angina pectoris: Secondary | ICD-10-CM

## 2018-01-04 DIAGNOSIS — I214 Non-ST elevation (NSTEMI) myocardial infarction: Secondary | ICD-10-CM

## 2018-01-04 HISTORY — PX: LEFT HEART CATH AND CORONARY ANGIOGRAPHY: CATH118249

## 2018-01-04 LAB — GLUCOSE, CAPILLARY
Glucose-Capillary: 168 mg/dL — ABNORMAL HIGH (ref 65–99)
Glucose-Capillary: 122 mg/dL — ABNORMAL HIGH (ref 65–99)
Glucose-Capillary: 125 mg/dL — ABNORMAL HIGH (ref 65–99)
Glucose-Capillary: 149 mg/dL — ABNORMAL HIGH (ref 65–99)

## 2018-01-04 LAB — CBC
HCT: 38.9 % — ABNORMAL LOW (ref 39.0–52.0)
Hemoglobin: 13.3 g/dL (ref 13.0–17.0)
MCH: 30.4 pg (ref 26.0–34.0)
MCHC: 34.2 g/dL (ref 30.0–36.0)
MCV: 88.8 fL (ref 78.0–100.0)
PLATELETS: 168 10*3/uL (ref 150–400)
RBC: 4.38 MIL/uL (ref 4.22–5.81)
RDW: 13.9 % (ref 11.5–15.5)
WBC: 6.7 10*3/uL (ref 4.0–10.5)

## 2018-01-04 LAB — APTT
APTT: 89 s — AB (ref 24–36)
aPTT: 88 seconds — ABNORMAL HIGH (ref 24–36)

## 2018-01-04 LAB — BASIC METABOLIC PANEL
Anion gap: 8 (ref 5–15)
BUN: 13 mg/dL (ref 6–20)
CHLORIDE: 107 mmol/L (ref 101–111)
CO2: 27 mmol/L (ref 22–32)
Calcium: 9.7 mg/dL (ref 8.9–10.3)
Creatinine, Ser: 1.12 mg/dL (ref 0.61–1.24)
GFR calc Af Amer: >60 mL/min (ref >60)
GFR calc non Af Amer: >60 mL/min (ref >60)
GLUCOSE: 139 mg/dL — AB (ref 65–99)
POTASSIUM: 3.7 mmol/L (ref 3.5–5.1)
Sodium: 142 mmol/L (ref 135–145)

## 2018-01-04 LAB — TROPONIN I
TROPONIN I: 8.6 ng/mL — AB (ref <0.03)
Troponin I: 5.83 ng/mL (ref <0.03)
Troponin I: 8.18 ng/mL (ref <0.03)

## 2018-01-04 LAB — HEPARIN LEVEL (UNFRACTIONATED)
HEPARIN UNFRACTIONATED: 1.1 [IU]/mL — AB (ref 0.30–0.70)
Heparin Unfractionated: 0.92 IU/mL — ABNORMAL HIGH (ref 0.30–0.70)

## 2018-01-04 SURGERY — LEFT HEART CATH AND CORONARY ANGIOGRAPHY
Anesthesia: LOCAL

## 2018-01-04 MED ORDER — LIDOCAINE HCL (PF) 1 % IJ SOLN
INTRAMUSCULAR | Status: DC | PRN
  Administered 2018-01-04: 2 mL via INTRADERMAL

## 2018-01-04 MED ORDER — HEPARIN SODIUM (PORCINE) 1000 UNIT/ML IJ SOLN
INTRAMUSCULAR | Status: DC | PRN
  Administered 2018-01-04: 4500 [IU] via INTRAVENOUS

## 2018-01-04 MED ORDER — FENTANYL CITRATE (PF) 100 MCG/2ML IJ SOLN
INTRAMUSCULAR | Status: DC | PRN
  Administered 2018-01-04: 25 ug via INTRAVENOUS

## 2018-01-04 MED ORDER — SODIUM CHLORIDE 0.9% FLUSH
3.0000 mL | Freq: Two times a day (BID) | INTRAVENOUS | Status: DC
  Administered 2018-01-04: 3 mL via INTRAVENOUS

## 2018-01-04 MED ORDER — HEPARIN (PORCINE) IN NACL 100-0.45 UNIT/ML-% IJ SOLN
1400.0000 [IU]/h | INTRAMUSCULAR | Status: AC
  Administered 2018-01-04: 1400 [IU]/h via INTRAVENOUS
  Filled 2018-01-04 (×2): qty 250

## 2018-01-04 MED ORDER — SODIUM CHLORIDE 0.9 % IV SOLN
INTRAVENOUS | Status: AC
  Administered 2018-01-04: 16:00:00 via INTRAVENOUS

## 2018-01-04 MED ORDER — IOPAMIDOL (ISOVUE-370) INJECTION 76%
INTRAVENOUS | Status: DC | PRN
  Administered 2018-01-04: 85 mL via INTRA_ARTERIAL

## 2018-01-04 MED ORDER — HEPARIN (PORCINE) IN NACL 2-0.9 UNITS/ML
INTRAMUSCULAR | Status: AC | PRN
  Administered 2018-01-04 (×2): 500 mL via INTRA_ARTERIAL

## 2018-01-04 MED ORDER — SODIUM CHLORIDE 0.9 % WEIGHT BASED INFUSION
3.0000 mL/kg/h | INTRAVENOUS | Status: DC
  Administered 2018-01-04: 07:00:00 3 mL/kg/h via INTRAVENOUS

## 2018-01-04 MED ORDER — SODIUM CHLORIDE 0.9 % IV SOLN: 250.0000 mL | INTRAVENOUS | Status: DC | PRN

## 2018-01-04 MED ORDER — SODIUM CHLORIDE 0.9% FLUSH: 3.0000 mL | INTRAVENOUS | Status: DC | PRN

## 2018-01-04 MED ORDER — SODIUM CHLORIDE 0.9 % WEIGHT BASED INFUSION: 1.0000 mL/kg/h | INTRAVENOUS | Status: DC

## 2018-01-04 MED ORDER — SODIUM CHLORIDE 0.9% FLUSH
3.0000 mL | Freq: Two times a day (BID) | INTRAVENOUS | Status: DC
  Administered 2018-01-05 – 2018-01-06 (×3): 3 mL via INTRAVENOUS

## 2018-01-04 MED ORDER — MIDAZOLAM HCL 2 MG/2ML IJ SOLN
INTRAMUSCULAR | Status: DC | PRN
  Administered 2018-01-04: 1 mg via INTRAVENOUS

## 2018-01-04 MED ORDER — HEPARIN (PORCINE) IN NACL 2-0.9 UNIT/ML-% IJ SOLN
INTRAMUSCULAR | Status: DC | PRN
  Administered 2018-01-04: 10 mL via INTRA_ARTERIAL

## 2018-01-04 MED ORDER — ASPIRIN 81 MG PO CHEW
81.0000 mg | CHEWABLE_TABLET | ORAL | Status: AC
  Administered 2018-01-04: 07:00:00 81 mg via ORAL
  Filled 2018-01-04: qty 1

## 2018-01-04 SURGICAL SUPPLY — 13 items
CATH INFINITI 5FR ANG PIGTAIL (CATHETERS) ×1 IMPLANT
CATH INFINITI 5FR JK (CATHETERS) ×1 IMPLANT
DEVICE RAD COMP TR BAND LRG (VASCULAR PRODUCTS) ×1 IMPLANT
GUIDEWIRE INQWIRE 1.5J.035X260 (WIRE) IMPLANT
INQWIRE 1.5J .035X260CM (WIRE) ×2
KIT HEART LEFT (KITS) ×2 IMPLANT
NDL PERC 21GX4CM (NEEDLE) IMPLANT
NEEDLE PERC 21GX4CM (NEEDLE) ×2 IMPLANT
PACK CARDIAC CATHETERIZATION (CUSTOM PROCEDURE TRAY) ×2 IMPLANT
SHEATH RAIN RADIAL 21G 6FR (SHEATH) ×1 IMPLANT
SYR MEDRAD MARK V 150ML (SYRINGE) ×2 IMPLANT
TRANSDUCER W/STOPCOCK (MISCELLANEOUS) ×2 IMPLANT
TUBING CIL FLEX 10 FLL-RA (TUBING) ×2 IMPLANT

## 2018-01-04 NOTE — H&P (View-Only) (Signed)
Progress Note  Patient Name: Marc [name] Date of Encounter: 01/04/2018  Primary Cardiologist: Jenean Lindau, MD   Subjective   Chest pain started yesterday after eating breakfast, lasted all day until dinner time. Resolved since then. No SOB.  Inpatient Medications    Scheduled Meds: . aspirin EC  81 mg Oral Daily  . benazepril  20 mg Oral BID  . carvedilol  12.5 mg Oral BID WC  . diltiazem  300 mg Oral Daily  . insulin aspart  0-5 Units Subcutaneous QHS  . insulin aspart  0-9 Units Subcutaneous TID WC  . insulin glargine  5 Units Subcutaneous Q2200  . pravastatin  40 mg Oral QHS  . sodium chloride flush  3 mL Intravenous Q12H  . tamsulosin  0.4 mg Oral QPC supper   Continuous Infusions: . sodium chloride    . sodium chloride 3 mL/kg/hr (01/04/18 0630)   Followed by  . sodium chloride    . heparin 1,400 Units/hr (01/04/18 0500)   PRN Meds: sodium chloride, acetaminophen, ALPRAZolam, nitroGLYCERIN, ondansetron (ZOFRAN) IV, sodium chloride flush, traMADol-acetaminophen   Vital Signs    Vitals:   01/04/18 0300 01/04/18 0400 01/04/18 0500 01/04/18 0600  BP: (!) 167/61 (!) 150/78 (!) 150/80 131/79  Pulse:      Resp: 14 16 20 13   Temp:      TempSrc:      SpO2: 98% 98% 98% 98%  Weight:      Height:        Intake/Output Summary (Last 24 hours) at 01/04/2018 0720 Last data filed at 01/04/2018 0500 Gross per 24 hour  Intake 337.53 ml  Output -  Net 337.53 ml   Filed Weights   01/03/18 2148 01/04/18 0118  Weight: 194 lb 14.2 oz (88.4 kg) 194 lb 14.2 oz (88.4 kg)    Telemetry    Atrial fibrillation, rate controlled - Personally Reviewed  ECG    Atrial fibrillation with minimal ST elevation in inferior leads. - Personally Reviewed  Physical Exam   GEN: No acute distress.   Neck: No JVD Cardiac: irregularly irregular, no murmurs, rubs, or gallops.  Respiratory: Clear to auscultation bilaterally. GI: Soft, nontender, non-distended  MS: No edema; No  deformity. Neuro:  Nonfocal  Psych: Normal affect   Labs    Chemistry Recent Labs  Lab 01/03/18 2255  NA 142  K 3.7  CL 107  CO2 27  GLUCOSE 139*  BUN 13  CREATININE 1.12  CALCIUM 9.7  GFRNONAA >60  GFRAA >60  ANIONGAP 8     Hematology Recent Labs  Lab 01/04/18 0059  WBC 6.7  RBC 4.38  HGB 13.3  HCT 38.9*  MCV 88.8  MCH 30.4  MCHC 34.2  RDW 13.9  PLT 168    Cardiac Enzymes Recent Labs  Lab 01/03/18 2255 01/04/18 0059  TROPONINI 5.83* 8.60*   No results for input(s): TROPIPOC in the last 168 hours.   BNPNo results for input(s): BNP, PROBNP in the last 168 hours.   DDimer No results for input(s): DDIMER in the last 168 hours.   Radiology    No results found.  Cardiac Studies   Cath 11/28/2014   Patient Profile     77 y.o. male with PMH of CAD, permanent atrial fibrillation, HTN, HLD, DM II on insulin and MR presented with prolonged chest pain and elevated trop.   Assessment & Plan    1. NSTEMI: EKG showed minimal ST elevation in inferior leads. Trop 8.6.   -  serial enzyme, IV heparin. Repeat TTE  - given minimal ST elevation in inferior leads on previous EKG, will repeat EKG  - plan for cardiac catheterization. Last dose of Xarelto per patient was Sunday, no Xarelto was given at [place] and benefit of procedure explained to the patient who display clear understanding and agree to proceed. Discussed with patient possible procedural risk include bleeding, vascular injury, renal injury, arrythmia, MI, stroke and loss of limb or life.  2. CAD: cath 02/25/2011 showed 20% mid LM, 20% prox and mid D1, 20% ost LCx, EF 45-50%.   - cath 11/28/2014 Xience 3.25x15 mm DES to prox RCA  3. Permanent atrial fibrillation on Xarelto: Although previous note mentioned paroxysmal afib, he is likely permanent atrial fibrillation as every single EKG dating back to 2011 shows afib, I could not locate any EKG in sinus rhythm.   - on Xarelto 15mg  daily at  home, currently on hold given the need for cardiac catheterization. IV heparin started at [facility], last dose of Xarelto per patient was Sunday. Not sure why he is on 15mg  daily, CrCl 70 ml/min based on Cockcroft-Gault Equation, since CrCl is > 48ml/min, he qualify for 20mg  daily dosing upon discharge.   4. HTN: continue current dose of Coreg.   5. HLD: FLP tomorrow AM, on pravachol, may need stronger statins. Intolerant to Zocor  6. DM II: on insulin at home. SSI. Hemoglobin A1C 6.4  7. Mitral regurgitation: mild to moderate MR by echo 02/19/2011, repeat echo   For questions or updates, please contact [place] Please consult [email or web address] for contact info under Cardiology/STEMI.      SignedAlmyra Deforest, PA  01/04/2018, 7:20 AM     The patient was seen, examined and discussed with Almyra Deforest, PA-C and I agree with the above.   77 year old male with h/o RCA stent who presented with typical chest pain and troponin elevation 5.8->8.6, on heparin drip since 7 pm last night, last dose of Xarelto on 4/28, cath scheduled for today. Consider switching to rosuvastatin at discharge, listed allergy to simva and atorvastatin.   Ena Dawley, MD 01/04/2018

## 2018-01-04 NOTE — Progress Notes (Addendum)
Progress Note  Patient Name: Marc [name] Date of Encounter: 01/04/2018  Primary Cardiologist: Jenean Lindau, MD   Subjective   Chest pain started yesterday after eating breakfast, lasted all day until dinner time. Resolved since then. No SOB.  Inpatient Medications    Scheduled Meds: . aspirin EC  81 mg Oral Daily  . benazepril  20 mg Oral BID  . carvedilol  12.5 mg Oral BID WC  . diltiazem  300 mg Oral Daily  . insulin aspart  0-5 Units Subcutaneous QHS  . insulin aspart  0-9 Units Subcutaneous TID WC  . insulin glargine  5 Units Subcutaneous Q2200  . pravastatin  40 mg Oral QHS  . sodium chloride flush  3 mL Intravenous Q12H  . tamsulosin  0.4 mg Oral QPC supper   Continuous Infusions: . sodium chloride    . sodium chloride 3 mL/kg/hr (01/04/18 0630)   Followed by  . sodium chloride    . heparin 1,400 Units/hr (01/04/18 0500)   PRN Meds: sodium chloride, acetaminophen, ALPRAZolam, nitroGLYCERIN, ondansetron (ZOFRAN) IV, sodium chloride flush, traMADol-acetaminophen   Vital Signs    Vitals:   01/04/18 0300 01/04/18 0400 01/04/18 0500 01/04/18 0600  BP: (!) 167/61 (!) 150/78 (!) 150/80 131/79  Pulse:      Resp: 14 16 20 13   Temp:      TempSrc:      SpO2: 98% 98% 98% 98%  Weight:      Height:        Intake/Output Summary (Last 24 hours) at 01/04/2018 0720 Last data filed at 01/04/2018 0500 Gross per 24 hour  Intake 337.53 ml  Output -  Net 337.53 ml   Filed Weights   01/03/18 2148 01/04/18 0118  Weight: 194 lb 14.2 oz (88.4 kg) 194 lb 14.2 oz (88.4 kg)    Telemetry    Atrial fibrillation, rate controlled - Personally Reviewed  ECG    Atrial fibrillation with minimal ST elevation in inferior leads. - Personally Reviewed  Physical Exam   GEN: No acute distress.   Neck: No JVD Cardiac: irregularly irregular, no murmurs, rubs, or gallops.  Respiratory: Clear to auscultation bilaterally. GI: Soft, nontender, non-distended  MS: No edema; No  deformity. Neuro:  Nonfocal  Psych: Normal affect   Labs    Chemistry Recent Labs  Lab 01/03/18 2255  NA 142  K 3.7  CL 107  CO2 27  GLUCOSE 139*  BUN 13  CREATININE 1.12  CALCIUM 9.7  GFRNONAA >60  GFRAA >60  ANIONGAP 8     Hematology Recent Labs  Lab 01/04/18 0059  WBC 6.7  RBC 4.38  HGB 13.3  HCT 38.9*  MCV 88.8  MCH 30.4  MCHC 34.2  RDW 13.9  PLT 168    Cardiac Enzymes Recent Labs  Lab 01/03/18 2255 01/04/18 0059  TROPONINI 5.83* 8.60*   No results for input(s): TROPIPOC in the last 168 hours.   BNPNo results for input(s): BNP, PROBNP in the last 168 hours.   DDimer No results for input(s): DDIMER in the last 168 hours.   Radiology    No results found.  Cardiac Studies   Cath 11/28/2014   Patient Profile     77 y.o. male with PMH of CAD, permanent atrial fibrillation, HTN, HLD, DM II on insulin and MR presented with prolonged chest pain and elevated trop.   Assessment & Plan    1. NSTEMI: EKG showed minimal ST elevation in inferior leads. Trop 8.6.   -  serial enzyme, IV heparin. Repeat TTE  - given minimal ST elevation in inferior leads on previous EKG, will repeat EKG  - plan for cardiac catheterization. Last dose of Xarelto per patient was Sunday, no Xarelto was given at [place] and benefit of procedure explained to the patient who display clear understanding and agree to proceed. Discussed with patient possible procedural risk include bleeding, vascular injury, renal injury, arrythmia, MI, stroke and loss of limb or life.  2. CAD: cath 02/25/2011 showed 20% mid LM, 20% prox and mid D1, 20% ost LCx, EF 45-50%.   - cath 11/28/2014 Xience 3.25x15 mm DES to prox RCA  3. Permanent atrial fibrillation on Xarelto: Although previous note mentioned paroxysmal afib, he is likely permanent atrial fibrillation as every single EKG dating back to 2011 shows afib, I could not locate any EKG in sinus rhythm.   - on Xarelto 15mg  daily at  home, currently on hold given the need for cardiac catheterization. IV heparin started at [facility], last dose of Xarelto per patient was Sunday. Not sure why he is on 15mg  daily, CrCl 70 ml/min based on Cockcroft-Gault Equation, since CrCl is > 60ml/min, he qualify for 20mg  daily dosing upon discharge.   4. HTN: continue current dose of Coreg.   5. HLD: FLP tomorrow AM, on pravachol, may need stronger statins. Intolerant to Zocor  6. DM II: on insulin at home. SSI. Hemoglobin A1C 6.4  7. Mitral regurgitation: mild to moderate MR by echo 02/19/2011, repeat echo   For questions or updates, please contact [place] Please consult [email or web address] for contact info under Cardiology/STEMI.      SignedAlmyra Deforest, PA  01/04/2018, 7:20 AM     The patient was seen, examined and discussed with Almyra Deforest, PA-C and I agree with the above.   77 year old male with h/o RCA stent who presented with typical chest pain and troponin elevation 5.8->8.6, on heparin drip since 7 pm last night, last dose of Xarelto on 4/28, cath scheduled for today. Consider switching to rosuvastatin at discharge, listed allergy to simva and atorvastatin.   Ena Dawley, MD 01/04/2018

## 2018-01-04 NOTE — Interval H&P Note (Signed)
History and Physical Interval Note:  01/04/2018 3:23 PM  Marc [name]  has presented today for surgery, with the diagnosis of [id number]  The various methods of treatment have been discussed with the patient and family. After consideration of risks, benefits and other options for treatment, the patient has consented to  Procedure(s): LEFT HEART CATH AND CORONARY ANGIOGRAPHY (N/A) as a surgical intervention .  The patient's history has been reviewed, patient examined, no change in status, stable for surgery.  I have reviewed the patient's chart and labs.  Questions were answered to the patient's satisfaction.     Kathlyn [place]

## 2018-01-04 NOTE — Progress Notes (Signed)
[facility] BAND REMOVAL  LOCATION:    right radial  DEFLATED PER PROTOCOL:    Yes.    TIME BAND OFF / DRESSING APPLIED:    19:45   SITE UPON ARRIVAL:    Level 0  SITE AFTER BAND REMOVAL:    Level 0  CIRCULATION SENSATION AND MOVEMENT:    Within Normal Limits   Yes.    COMMENTS:   Post TR band instructions given. Pt tolerated well.

## 2018-01-04 NOTE — Progress Notes (Signed)
ANTICOAGULATION CONSULT NOTE - Follow Up Consult  Pharmacy Consult for heparin Indication: NSTEMI and Afib  Labs: Recent Labs    01/03/18 2255 01/04/18 0059 01/04/18 0356 01/04/18 0628  HGB  --  13.3  --   --   HCT  --  38.9*  --   --   PLT  --  168  --   --   APTT  --  88*  --  89*  HEPARINUNFRC  --  1.10* 0.92*  --   CREATININE 1.12  --   --   --   TROPONINI 5.83* 8.60*  --   --     Assessment/Plan:  77 yo male therapeutic on heparin for rule out ACS. He is on Xarelto prior to admission for AFib - this has been held. Currently using aPTTs to manage heparin; aPTTs have been therapeutic x2.  -Continue heparin at 1400 units/hr -Daily HL, CBC -F/u after cath today   Hughes Better, PharmD, BCPS Clinical Pharmacist 01/04/2018 8:45 AM

## 2018-01-04 NOTE — Progress Notes (Signed)
ANTICOAGULATION CONSULT NOTE - Follow Up Consult  Pharmacy Consult for heparin Indication: NSTEMI and Afib  Labs: Recent Labs    01/03/18 2255 01/04/18 0059  HGB  --  13.3  HCT  --  38.9*  PLT  --  168  APTT  --  88*  HEPARINUNFRC  --  1.10*  CREATININE 1.12  --   TROPONINI 5.83* 8.60*    Assessment/Plan:  77yo male therapeutic on heparin with initial dosing while Xarelto on hold. Will continue gtt at current rate and confirm stable with additional PTT.   Wynona Neat, PharmD, BCPS  01/04/2018,4:00 AM

## 2018-01-05 ENCOUNTER — Encounter (HOSPITAL_COMMUNITY): Payer: Self-pay | Admitting: Cardiovascular Disease

## 2018-01-05 ENCOUNTER — Inpatient Hospital Stay (HOSPITAL_COMMUNITY): Payer: Medicare Other

## 2018-01-05 DIAGNOSIS — I10 Essential (primary) hypertension: Secondary | ICD-10-CM

## 2018-01-05 DIAGNOSIS — I249 Acute ischemic heart disease, unspecified: Secondary | ICD-10-CM

## 2018-01-05 DIAGNOSIS — I214 Non-ST elevation (NSTEMI) myocardial infarction: Principal | ICD-10-CM

## 2018-01-05 DIAGNOSIS — E782 Mixed hyperlipidemia: Secondary | ICD-10-CM

## 2018-01-05 LAB — LIPID PANEL
CHOLESTEROL: 130 mg/dL (ref 0–200)
HDL: 34 mg/dL — AB (ref >40)
LDL Cholesterol: 84 mg/dL (ref 0–99)
TRIGLYCERIDES: 61 mg/dL (ref <150)
Total CHOL/HDL Ratio: 3.8 RATIO
VLDL: 12 mg/dL (ref 0–40)

## 2018-01-05 LAB — GLUCOSE, CAPILLARY
Glucose-Capillary: 184 mg/dL — ABNORMAL HIGH (ref 65–99)
Glucose-Capillary: 145 mg/dL — ABNORMAL HIGH (ref 65–99)
Glucose-Capillary: 113 mg/dL — ABNORMAL HIGH (ref 65–99)
Glucose-Capillary: 134 mg/dL — ABNORMAL HIGH (ref 65–99)

## 2018-01-05 LAB — CBC
HCT: 36.9 % — ABNORMAL LOW (ref 39.0–52.0)
Hemoglobin: 12.4 g/dL — ABNORMAL LOW (ref 13.0–17.0)
MCH: 30 pg (ref 26.0–34.0)
MCHC: 33.6 g/dL (ref 30.0–36.0)
MCV: 89.1 fL (ref 78.0–100.0)
PLATELETS: 156 10*3/uL (ref 150–400)
RBC: 4.14 MIL/uL — ABNORMAL LOW (ref 4.22–5.81)
RDW: 13.9 % (ref 11.5–15.5)
WBC: 6.7 10*3/uL (ref 4.0–10.5)

## 2018-01-05 LAB — HEPARIN LEVEL (UNFRACTIONATED): Heparin Unfractionated: 0.42 IU/mL (ref 0.30–0.70)

## 2018-01-05 LAB — ECHOCARDIOGRAM COMPLETE
HEIGHTINCHES: 72 in
WEIGHTICAEL: 3068.8 [oz_av]

## 2018-01-05 LAB — APTT: aPTT: 89 seconds — ABNORMAL HIGH (ref 24–36)

## 2018-01-05 MED ORDER — ROSUVASTATIN CALCIUM 20 MG PO TABS
20.0000 mg | ORAL_TABLET | Freq: Every day | ORAL | Status: DC
  Administered 2018-01-05: 18:00:00 20 mg via ORAL
  Filled 2018-01-05: qty 1

## 2018-01-05 MED ORDER — ALPRAZOLAM 0.25 MG PO TABS: 0.2500 mg | ORAL_TABLET | Freq: Three times a day (TID) | ORAL | Status: DC | PRN

## 2018-01-05 MED ORDER — ISOSORBIDE MONONITRATE ER 30 MG PO TB24
30.0000 mg | ORAL_TABLET | Freq: Every day | ORAL | Status: DC
  Administered 2018-01-05 – 2018-01-06 (×2): 30 mg via ORAL
  Filled 2018-01-05 (×2): qty 1

## 2018-01-05 MED ORDER — RIVAROXABAN 20 MG PO TABS
20.0000 mg | ORAL_TABLET | Freq: Every day | ORAL | Status: DC
Start: 2018-01-05 — End: 2018-01-06
  Administered 2018-01-05: 12:00:00 20 mg via ORAL
  Filled 2018-01-05: qty 1

## 2018-01-05 MED FILL — Heparin Sod (Porcine)-NaCl IV Soln 1000 Unit/500ML-0.9%: INTRAVENOUS | Qty: 1000 | Status: AC

## 2018-01-05 NOTE — Progress Notes (Signed)
BP=152/78, Imdur effective.

## 2018-01-05 NOTE — Progress Notes (Addendum)
Progress Note  Patient Name: Marc [name] Date of Encounter: 01/05/2018  Primary Cardiologist: Jenean Lindau, MD   Subjective   No chest pain since before he came up from [place]. No SOB.   Inpatient Medications    Scheduled Meds: . aspirin EC  81 mg Oral Daily  . benazepril  20 mg Oral BID  . carvedilol  12.5 mg Oral BID WC  . diltiazem  300 mg Oral Daily  . insulin aspart  0-5 Units Subcutaneous QHS  . insulin aspart  0-9 Units Subcutaneous TID WC  . insulin glargine  5 Units Subcutaneous Q2200  . pravastatin  40 mg Oral QHS  . sodium chloride flush  3 mL Intravenous Q12H  . tamsulosin  0.4 mg Oral QPC supper   Continuous Infusions: . sodium chloride    . heparin 1,400 Units/hr (01/05/18 0600)   PRN Meds: sodium chloride, acetaminophen, ALPRAZolam, nitroGLYCERIN, ondansetron (ZOFRAN) IV, sodium chloride flush, traMADol-acetaminophen   Vital Signs    Vitals:   Jan 18, 2018 2000 01/05/18 0330 01/05/18 0748 01/05/18 0809  BP: (!) 163/70 (!) 166/83 (!) 153/86 (!) 180/67  Pulse: (!) 46 98 (!) 49 88  Resp: 18 18 16    Temp: 99.6 F (37.6 C) 99.1 F (37.3 C) 98.6 F (37 C)   TempSrc: Oral Oral    SpO2: 97% 95% 97%   Weight:  191 lb 12.8 oz (87 kg)    Height:        Intake/Output Summary (Last 24 hours) at 01/05/2018 0817 Last data filed at 01/05/2018 0600 Gross per 24 hour  Intake 847.58 ml  Output -  Net 847.58 ml   Filed Weights   01/03/18 2148 01/18/18 0118 01/05/18 0330  Weight: 194 lb 14.2 oz (88.4 kg) 194 lb 14.2 oz (88.4 kg) 191 lb 12.8 oz (87 kg)    Telemetry    Atrial fibrillation, rate controlled - Personally Reviewed  ECG    Atrial fibrillation with minimal ST elevation in inferior leads. - Personally Reviewed  Physical Exam   GEN: No acute distress.   Neck: No JVD Cardiac: irregularly irregular, no murmurs, rubs, or gallops.  Respiratory: Clear to auscultation bilaterally. GI: Soft, nontender, non-distended  MS: No edema; No  deformity. Neuro:  Nonfocal  Psych: Normal affect   Labs    Chemistry Recent Labs  Lab 01/03/18 2255  NA 142  K 3.7  CL 107  CO2 27  GLUCOSE 139*  BUN 13  CREATININE 1.12  CALCIUM 9.7  GFRNONAA >60  GFRAA >60  ANIONGAP 8     Hematology Recent Labs  Lab 01-18-18 0059 01/05/18 0211  WBC 6.7 6.7  RBC 4.38 4.14*  HGB 13.3 12.4*  HCT 38.9* 36.9*  MCV 88.8 89.1  MCH 30.4 30.0  MCHC 34.2 33.6  RDW 13.9 13.9  PLT 168 156    Cardiac Enzymes Recent Labs  Lab 01/03/18 2255 2018/01/18 0059 18-Jan-2018 0848  TROPONINI 5.83* 8.60* 8.18*   No results for input(s): TROPIPOC in the last 168 hours.   BNPNo results for input(s): BNP, PROBNP in the last 168 hours.   DDimer No results for input(s): DDIMER in the last 168 hours.   Radiology    No results found.  Cardiac Studies   Cath 11/28/2014    Cath Jan 18, 2018 Conclusion     Ost RCA to Prox RCA lesion is 50% stenosed.  Prox RCA to Mid RCA lesion is 40% stenosed.  Dist RCA lesion is 100% stenosed.  Colon Flattery  LM lesion is 20% stenosed.  Ost Cx to Prox Cx lesion is 60% stenosed.  Prox LAD lesion is 30% stenosed.  There is mild left ventricular systolic dysfunction.  LV end diastolic pressure is moderately elevated.  The left ventricular ejection fraction is 35-45% by visual estimate.   1.  Severe one-vessel coronary artery disease with thrombotic occlusion of the distal right coronary artery.  The RCA and LAD are both heavily calcified.  There is an ostial stent in the right coronary artery which appears to be underexpanded due to significant calcifications.  There is large organized thrombus in the distal right coronary artery with good left-to-right collaterals. 2.  Moderately reduced LV systolic function with an EF of 35 to 40% with inferior wall hypokinesis.  Mildly elevated left ventricular end-diastolic pressure.  Recommendations: The patient's symptoms started about 24 hours ago with large organized  thrombus noted in the distal right coronary artery and left to right collaterals.  PCI would be associated with significant risk of embolization into the distal artery.  Also the ostial RCA stent appears to be underexpanded due to heavy calcifications.  Based on this, the risks of PCI outweigh the benefits and I recommend medical therapy.  Heparin can be resumed 8 hours after sheath pull with plans to resume Xarelto tomorrow if no bleeding issues.     Patient Profile     77 y.o. male with PMH of CAD, permanent atrial fibrillation, HTN, HLD, DM II on insulin and MR presented with prolonged chest pain and elevated trop.   Assessment & Plan    1. NSTEMI: EKG showed minimal ST elevation in inferior leads. Trop 8.6.   - serial enzyme, IV heparin. Repeat TTE  - given minimal ST elevation in inferior leads on previous EKG, will repeat EKG  - Cath 01/04/2018 showed 50% ost RCA with the stent appearing underexpanded due to significant calcification, thrombotic occlusion of distal RCA with L to R collaterals. 60% ost LCx, 30% LAD, EF 35-45%. Medical therapy recommended  - continue coreg and benazepril, continue ASA, no plavix given the need for Xarelto  - pending echo, if EF < 40%, add spironolactone. Likely discharge tomorrow.   2. CAD: cath 02/25/2011 showed 20% mid LM, 20% prox and mid D1, 20% ost LCx, EF 45-50%.   - cath 11/28/2014 Xience 3.25x15 mm DES to prox RCA  3. Permanent atrial fibrillation on Xarelto: Although previous note mentioned paroxysmal afib, he is likely permanent atrial fibrillation as every single EKG dating back to 2011 shows afib, I could not locate any EKG in sinus rhythm.   - on Xarelto 15mg  daily at home, currently on hold given the need for cardiac catheterization. [facility] started at [facility], last dose of Xarelto per patient was Sunday. Not sure why he is on 15mg  daily, CrCl 70 ml/min based on Cockcroft-Gault Equation, since CrCl is > 23ml/min, he qualify for 20mg   daily dosing   - plan to restart Xarelto this afternoon at 20mg  daily. D/C IV heparin after that, patient to ambulate.   - rate controlled on Coreg and diltiazem  4. HTN: continue current dose of Coreg.   5. HLD:  Intolerant to Zocor, Total cholesterol, triglyceride, and HDL controlled, but LDL still not controlled. Switch Pravachol to 20mg  daily Crestor  6. DM II: on insulin at home. SSI. Hemoglobin A1C 6.4  7. Mitral regurgitation: mild to moderate MR by echo 02/19/2011, repeat echo   For questions or updates, please contact [place] Please  consult [email or web address] for contact info under Cardiology/STEMI.     Hilbert Corrigan, PA  01/05/2018, 8:17 AM     The patient was seen, examined and discussed with Almyra Deforest, PA-C and I agree with the above.   76 year old male with h/o RCA stent who presented with typical chest pain and troponin elevation 5.8->8.6->8.1, cath showed 50% ost RCA with the stent appearing underexpanded due to significant calcification, thrombotic occlusion of distal RCA with L to R collaterals. 60% ost LCx, 30% LAD, EF 35-45%. Medical therapy recommended, we will continue coreg and benazepril, continue ASA, no plavix given the need for Xarelto, switch heparin drip to Xarelto tonight, LVEF unknown, if EF < 40%, add spironolactone. Switch pravastatin to rosuvastatin 40 mg po daily at discharge as he has listed allergy for simva/atorvastatin. Anticipated discharge tomorrow. He is chest pain free, telemetry shows a-fib with controlled rate, PVCs, BP uncontrolled, I will add imdur 30 mg po daily.  Ena Dawley, MD 01/05/2018

## 2018-01-05 NOTE — Progress Notes (Signed)
  Echocardiogram 2D Echocardiogram has been performed.  Marc [name] 01/05/2018, 11:02 AM

## 2018-01-05 NOTE — Progress Notes (Signed)
Meng PA notified of increased BP=188/96, Imdur ordered and given. Will continue to monitor.

## 2018-01-05 NOTE — Research (Signed)
AEGIS II Research study reviewed with patient. Questions encouraged and answered. Patient does not want to participant.

## 2018-01-05 NOTE — Progress Notes (Signed)
Phase 1 cardiac rehab 1110-1135. Blood pressure 188/96. Will not walk due to elevated BP. RN notified. Patient given diabetic heart healthy diet information. Mr Mccready told me has has eaten what he wants for all of his life and does not plan to change now. Mr Spanos says he is interested in participating in phase 2 cardiac rehab at [facility]. Will check back later time permitting and ambulate if blood pressure is improved.Barnet Pall, RN,BSN 01/05/2018 11:35 AM

## 2018-01-05 NOTE — Discharge Instructions (Addendum)

## 2018-01-05 NOTE — Consult Note (Signed)
[facility] CM Primary Care Navigator  01/05/2018  Marc Marc [name] [birth year] [id number]   Met with Marc [name] at the bedsideto identify possible discharge needs.  Marc [name] reports having "chest pains" thathad led to this admission.  Marc [name] uses [facility] in [place] to obtain medications without difficulty. He states managing his own medications at home straight out of the containers. Marc [name] reports that he was driving prior to admission, but daughter Marc Marc [name])  provides transportation to his doctors' appointments when needed. Marc [name]'s children Marc Marc [name] and Marc Marc [name]) are his caregivers at home if needed after discharge.   Marc [name] mentioned thathis primary care provider is Dr. Gilford Rile with [facility] under [facility] -which is not under [facility]  Anticipated discharge plan is home according to Marc [name]. Marc [name] was encouraged to follow-up with his primary care providerafter hospital discharge or when he returns home.  Reverified eligibility with [facility] CMA, but states that Marc [name]'s PCP- primary care provider is not under [place], therefore, not covered.    For additional questions please contact:  Edwena Felty A. Anjanette Gilkey, BSN, RN-BC [facility] PRIMARY CARE Navigator Cell: [phone]

## 2018-01-05 NOTE — Progress Notes (Addendum)
ANTICOAGULATION CONSULT NOTE - Follow Up Consult  Pharmacy Consult for heparin > Xarelto Indication: AFib  Labs: Recent Labs    01/03/18 2255 01/04/18 0059 01/04/18 0356 01/04/18 0354 01/04/18 0848 01/05/18 0211 01/05/18 0828  HGB  --  13.3  --   --   --  12.4*  --   HCT  --  38.9*  --   --   --  36.9*  --   PLT  --  168  --   --   --  156  --   APTT  --  88*  --  89*  --   --  89*  HEPARINUNFRC  --  1.10* 0.92*  --   --   --  0.42  CREATININE 1.12  --   --   --   --   --   --   TROPONINI 5.83* 8.60*  --   --  8.18*  --   --     Assessment/Plan:  77 yo male who is on Xarelto prior to admission for AFib. He was transitioned to heparin while he was inpatient. He had a cardiac cath yesterday > cardiology recommended medical management. He has a prior PCI from a few years ago. Planning to now switch the patient back to Xarelto. It appears the patient was taking Xarelto 15 mg prior to admission. At first I thought this was reported incorrectly from the patient, his outpatient notes state that he should be on Xarelto 20 mg daily, but the dispensing record from his Pharmacy show that he was given Xarelto 15 mg tablets - I am unsure of the reason for this.  -Discontinue heparin at noon -Administer Xarelto 20 mg po daily at the same time heparin is discontinued -Discussed with RN and patient   Hughes Better, PharmD, BCPS Clinical Pharmacist 01/05/2018 9:52 AM

## 2018-01-06 LAB — CBC
HEMATOCRIT: 34.6 % — AB (ref 39.0–52.0)
HEMOGLOBIN: 11.5 g/dL — AB (ref 13.0–17.0)
MCH: 29.6 pg (ref 26.0–34.0)
MCHC: 33.2 g/dL (ref 30.0–36.0)
MCV: 89.2 fL (ref 78.0–100.0)
Platelets: 137 10*3/uL — ABNORMAL LOW (ref 150–400)
RBC: 3.88 MIL/uL — ABNORMAL LOW (ref 4.22–5.81)
RDW: 13.8 % (ref 11.5–15.5)
WBC: 5.8 10*3/uL (ref 4.0–10.5)

## 2018-01-06 LAB — GLUCOSE, CAPILLARY: Glucose-Capillary: 130 mg/dL — ABNORMAL HIGH (ref 65–99)

## 2018-01-06 MED ORDER — ASPIRIN 81 MG PO TBEC: 81.0000 mg | DELAYED_RELEASE_TABLET | Freq: Every day | ORAL | Status: DC

## 2018-01-06 MED ORDER — ISOSORBIDE MONONITRATE ER 30 MG PO TB24: 30.0000 mg | ORAL_TABLET | Freq: Every day | ORAL | 5 refills | Status: DC

## 2018-01-06 MED ORDER — NITROGLYCERIN 0.4 MG SL SUBL: 0.4000 mg | SUBLINGUAL_TABLET | SUBLINGUAL | 2 refills | Status: AC | PRN

## 2018-01-06 MED ORDER — RIVAROXABAN 20 MG PO TABS: 20.0000 mg | ORAL_TABLET | Freq: Every day | ORAL | 11 refills | Status: DC

## 2018-01-06 MED ORDER — CARVEDILOL 12.5 MG PO TABS: 25.0000 mg | ORAL_TABLET | Freq: Two times a day (BID) | ORAL | Status: DC

## 2018-01-06 MED ORDER — ROSUVASTATIN CALCIUM 20 MG PO TABS: 20.0000 mg | ORAL_TABLET | Freq: Every day | ORAL | 5 refills | Status: DC

## 2018-01-06 MED ORDER — CARVEDILOL 25 MG PO TABS: 25.0000 mg | ORAL_TABLET | Freq: Two times a day (BID) | ORAL | 5 refills | Status: DC

## 2018-01-06 NOTE — Progress Notes (Addendum)
Progress Note  Patient Name: Marc [name] Date of Encounter: 01/06/2018  Primary Cardiologist: Jenean Lindau, MD   Subjective   Feeling fine this morning. No complaints. Denies CP. No dyspnea.   Inpatient Medications    Scheduled Meds: . aspirin EC  81 mg Oral Daily  . benazepril  20 mg Oral BID  . carvedilol  12.5 mg Oral BID WC  . diltiazem  300 mg Oral Daily  . insulin aspart  0-5 Units Subcutaneous QHS  . insulin aspart  0-9 Units Subcutaneous TID WC  . insulin glargine  5 Units Subcutaneous Q2200  . isosorbide mononitrate  30 mg Oral Daily  . rivaroxaban  20 mg Oral Q supper  . rosuvastatin  20 mg Oral q1800  . sodium chloride flush  3 mL Intravenous Q12H  . tamsulosin  0.4 mg Oral QPC supper   Continuous Infusions: . sodium chloride     PRN Meds: sodium chloride, acetaminophen, ALPRAZolam, nitroGLYCERIN, ondansetron (ZOFRAN) IV, sodium chloride flush, traMADol-acetaminophen   Vital Signs    Vitals:   01/05/18 1631 01/05/18 2000 01/05/18 2020 01/06/18 0316  BP: 140/69  (!) 149/83 (!) 151/76  Pulse: 80  78 84  Resp: 15 12 20 17   Temp: 99.4 F (37.4 C)  99.4 F (37.4 C) (!) 100.4 F (38 C)  TempSrc: Oral  Oral Oral  SpO2: 96%  92% 100%  Weight:    201 lb 9.6 oz (91.4 kg)  Height:        Intake/Output Summary (Last 24 hours) at 01/06/2018 0645 Last data filed at 01/05/2018 1225 Gross per 24 hour  Intake 329.83 ml  Output -  Net 329.83 ml   Filed Weights   01/04/18 0118 01/05/18 0330 01/06/18 0316  Weight: 194 lb 14.2 oz (88.4 kg) 191 lb 12.8 oz (87 kg) 201 lb 9.6 oz (91.4 kg)    Telemetry    Atrial fibrillation w/ CVR - Personally Reviewed  ECG    Atrial fibrillation, 63 bpm  - Personally Reviewed  Physical Exam   GEN: No acute distress.   Neck: No JVD Cardiac: irreguarlly irregular rhythm, regular rate, no murmurs, rubs, or gallops.  Respiratory: Clear to auscultation bilaterally. GI: Soft, nontender, non-distended  MS: No edema; No  deformity. Neuro:  Nonfocal  Psych: Normal affect   Labs    Chemistry Recent Labs  Lab 01/03/18 2255  NA 142  K 3.7  CL 107  CO2 27  GLUCOSE 139*  BUN 13  CREATININE 1.12  CALCIUM 9.7  GFRNONAA >60  GFRAA >60  ANIONGAP 8     Hematology Recent Labs  Lab 01/04/18 0059 01/05/18 0211 01/06/18 0232  WBC 6.7 6.7 5.8  RBC 4.38 4.14* 3.88*  HGB 13.3 12.4* 11.5*  HCT 38.9* 36.9* 34.6*  MCV 88.8 89.1 89.2  MCH 30.4 30.0 29.6  MCHC 34.2 33.6 33.2  RDW 13.9 13.9 13.8  PLT 168 156 137*    Cardiac Enzymes Recent Labs  Lab 01/03/18 2255 01/04/18 0059 01/04/18 0848  TROPONINI 5.83* 8.60* 8.18*   No results for input(s): TROPIPOC in the last 168 hours.   BNPNo results for input(s): BNP, PROBNP in the last 168 hours.   DDimer No results for input(s): DDIMER in the last 168 hours.   Radiology    No results found.  Cardiac Studies   Cath 01/04/2018 Conclusion     Ost RCA to Prox RCA lesion is 50% stenosed.  Prox RCA to Mid RCA lesion is  40% stenosed.  Dist RCA lesion is 100% stenosed.  Ost LM lesion is 20% stenosed.  Ost Cx to Prox Cx lesion is 60% stenosed.  Prox LAD lesion is 30% stenosed.  There is mild left ventricular systolic dysfunction.  LV end diastolic pressure is moderately elevated.  The left ventricular ejection fraction is 35-45% by visual estimate.  1. Severe one-vessel coronary artery disease with thrombotic occlusion of the distal right coronary artery. The RCA and LAD are both heavily calcified. There is an ostial stent in the right coronary artery which appears to be underexpanded due to significant calcifications. There is large organized thrombus in the distal right coronary artery with good left-to-right collaterals. 2. Moderately reduced LV systolic function with an EF of 35 to 40% with inferior wall hypokinesis. Mildly elevated left ventricular end-diastolic pressure.  Recommendations: The patient's symptoms started  about 24 hours ago with large organized thrombus noted in the distal right coronary artery and left to right collaterals. PCI would be associated with significant risk of embolization into the distal artery. Also the ostial RCA stent appears to be underexpanded due to heavy calcifications. Based on this, the risks of PCI outweigh the benefits and I recommend medical therapy. Heparin can be resumed 8 hours after sheath pull with plans to resume Xarelto tomorrow if no bleeding issues.   2D Echo 01/05/18 Study Conclusions  - Left ventricle: The cavity size was normal. Wall thickness was   increased in a pattern of severe LVH. The proximal IVS measures   1.9 cm. Systolic function was mildly to moderately reduced. The   estimated ejection fraction was in the range of 40% to 45%.   Diffuse hypokinesis. The study is not technically sufficient to   allow evaluation of LV diastolic function. - Mitral valve: Moderate thickening of the leaflets. The posterior   leaflet appears tethered. There is moderate, posteriorly directed   regurgitation. - Left atrium: Moderately dilated. - Right atrium: The atrium was mildly dilated. - Inferior vena cava: The vessel was dilated. The respirophasic   diameter changes were blunted (< 50%), consistent with elevated   central venous pressure.  Impressions:  - Compared to a prior study in 2012, the LVEF is lower at 40-45%   with diffuse hypokinesis. The mitral valve leaflets are thickened   with posterior leaflet tethering and moderate, posteriorly   directed mitral regurgitation is noted.   Patient Profile     77 y.o. male with PMH of CAD, permanent atrial fibrillation, HTN, HLD, DM II on insulin and MR presented with prolonged chest pain and and ruled in for NSTEMI.   Assessment & Plan    1. NSTEMI/CAD: troponin peaked at 8.6. LHC 01/04/18 showed 50% ost RCA with the stent appearing underexpanded due to significant calcification, thrombotic occlusion of  distal RCA with L to R collaterals. 60% ost LCx, 30% LAD, EF 35-45%. Medical therapy recommended. Continue ASA. No Plavix given need for Xarelto for afib. Continue BB, ACE-I and Imdur. EF is 40-45% by echo. May consider addition of spironolactone. He is CP free. No dyspnea.   2. Permanent Atrial Fibrillation: rate is controlled. Asymptomatic. Xarelto was resumed last night.   3. Cardiomyopathy: EF 40-45% by echo. Consider discontinuation of Cardizem and further titration of BB and ACE-I, + addition of spironolactone. Volume stable. No dyspnea.   4. HTN: moderately elevated. Will need to adjust regimen given cardiomyopathy. ? Stopping Cardizem, while increasing Coreg and benazepril. ? Adding spironolactone. He is also on Imdur.  5. HLD: LDL 84 and not at goal. LDL goal < 70 mg/dL. He was on pravastatin 40 mg prior to admit. Now on Crestor 20 mg. Repeat FLP and HFTs in 6-8 weeks.   6. T2DM: controlled. Hgb A1c 6.4. Resume home meds at discharge.   7. Mitral Regurgitation: per echo, there is moderate thickening of the leaflets. The posterior   leaflet appears tethered. There is moderate, posteriorly directed   regurgitation.  8. Anemia: slight drop in Hgb from 113.3>>12.4>>11.5. Will need f/u CBC as outpatient.   Dispo: likely d/c home today. MD to see.   For questions or updates, please contact [place] Please consult [email or web address] for contact info under Cardiology/STEMI.     Signed, Lyda Jester, PA-C  01/06/2018, 6:45 AM    The patient was seen, examined and discussed with Brittainy M. Rosita Fire, PA-C and I agree with the above.   77 year old male with h/o RCA stent who presented with typical chest pain and troponin elevation 5.8->8.6, off heparin drip since 7 pm last night,restarted on Xarelto, switch to rosuvastatin 20 mg po QHS at discharge, listed allergy to simva and atorvastatin. BP and HR up, I would increase carvedilol to 25 mg po BID.  Discharge today, we arrange for a  follow up.  Ena Dawley, MD 01/06/2018

## 2018-01-06 NOTE — Discharge Summary (Signed)
Discharge Summary    Patient ID: Marc [name],  MRN: [id number], DOB/AGE: [birth year] 76 y.o.  Admit date: 01/03/2018 Discharge date: 01/06/2018  Primary Care Provider: Gilford Rile A. Primary Cardiologist: Jenean Lindau, MD  Discharge Diagnoses    Active Problems:   ACS (acute coronary syndrome) (HCC)   Non-ST elevation (NSTEMI) myocardial infarction (HCC)   Allergies Allergies  Allergen Reactions  . Ticagrelor Shortness Of Breath and Other (See Comments)    * Brilinta* Pt short of breath  . Cephalexin Diarrhea    vomitting vomitting   . Lisinopril     Difficulty breathing   . Prednisone Other (See Comments)    GI Issues  . Simvastatin Other (See Comments)    Unsure    Diagnostic Studies/Procedures    Cath 01/04/2018 Conclusion     Ost RCA to Prox RCA lesion is 50% stenosed.  Prox RCA to Mid RCA lesion is 40% stenosed.  Dist RCA lesion is 100% stenosed.  Ost LM lesion is 20% stenosed.  Ost Cx to Prox Cx lesion is 60% stenosed.  Prox LAD lesion is 30% stenosed.  There is mild left ventricular systolic dysfunction.  LV end diastolic pressure is moderately elevated.  The left ventricular ejection fraction is 35-45% by visual estimate.  1. Severe one-vessel coronary artery disease with thrombotic occlusion of the distal right coronary artery. The RCA and LAD are both heavily calcified. There is an ostial stent in the right coronary artery which appears to be underexpanded due to significant calcifications. There is large organized thrombus in the distal right coronary artery with good left-to-right collaterals. 2. Moderately reduced LV systolic function with an EF of 35 to 40% with inferior wall hypokinesis. Mildly elevated left ventricular end-diastolic pressure.  Recommendations: The patient's symptoms started about 24 hours ago with large organized thrombus noted in the distal right coronary artery and left to right collaterals. PCI  would be associated with significant risk of embolization into the distal artery. Also the ostial RCA stent appears to be underexpanded due to heavy calcifications. Based on this, the risks of PCI outweigh the benefits and I recommend medical therapy. Heparin can be resumed 8 hours after sheath pull with plans to resume Xarelto tomorrow if no bleeding issues.   2D Echo 01/05/18 Study Conclusions  - Left ventricle: The cavity size was normal. Wall thickness was increased in a pattern of severe LVH. The proximal IVS measures 1.9 cm. Systolic function was mildly to moderately reduced. The estimated ejection fraction was in the range of 40% to 45%. Diffuse hypokinesis. The study is not technically sufficient to allow evaluation of LV diastolic function. - Mitral valve: Moderate thickening of the leaflets. The posterior leaflet appears tethered. There is moderate, posteriorly directed regurgitation. - Left atrium: Moderately dilated. - Right atrium: The atrium was mildly dilated. - Inferior vena cava: The vessel was dilated. The respirophasic diameter changes were blunted (<50%), consistent with elevated central venous pressure.  Impressions:  - Compared to a prior study in 2012, the LVEF is lower at 40-45% with diffuse hypokinesis. The mitral valve leaflets are thickened with posterior leaflet tethering and moderate, posteriorly directed mitral regurgitation is noted.   History of Present Illness    77 y.o.malewith PMH of CAD, permanent atrial fibrillation on Xarelto, HTN, HLD, DM II on insulin and MR, who presented with prolonged chest pain and ruled in for NSTEMI.   Pt initially presented to [facility] on 01/03/18 with complaint of chest pain,  retrosternal, that started the morning of admit. At the hospital, he was found to have an unremarkable ECG but his initial troponin was 0.5 and subsequently increased to 5.  Patient's was given aspirin, heparin  and carvedilol.  Patient's pain subsided while in the ED at [facility].  Patient was arranged for transfer to [facility] ED.  Hospital Course     On arrival to [facility], pt was admitted for NSTEMI and continued on IV heparin. Troponin peaked at 8.60. LHC was arranged on 01/04/18 and showed 50% ost RCA with the stent appearing underexpanded due to significant calcification, thrombotic occlusion of distal RCA with L to R collaterals. 60% ost LCx, 30% LAD, EF 35-45% by cath and 40-45% by echo. Medical therapy was recommended. He was continued on ASA. No Plavix given need for Xarelto for afib (Xarelto was resumed post cath). He was continued BB, ACE-I and Imdur. His statin was changed from Pravastatin to Crestor for better LDL reduction. His LDL was elevated at 84 mg/dL. He had no post cath complications. Renal function and cath site remained stable. His only issue post cath was hypertension and he required addition of Imdur and titration of Coreg to 25 mg BID. He tolerating medication adjustments w/o side effects and BP improved. He ambulated with cardiac rehab w/o difficulty. He was last seen and examined by Dr. Meda Coffee, who determined he was stable for d/c home. He will f/u with Dr. Geraldo Pitter. He will need a repeat FLP in 6-8 weeks.    Consultants: none    Discharge Vitals Blood pressure (!) 154/83, pulse 68, temperature 98 F (36.7 C), temperature source Oral, resp. rate 19, height 6' (1.829 m), weight 201 lb 9.6 oz (91.4 kg), SpO2 98 %.  Filed Weights   01/04/18 0118 01/05/18 0330 01/06/18 0316  Weight: 194 lb 14.2 oz (88.4 kg) 191 lb 12.8 oz (87 kg) 201 lb 9.6 oz (91.4 kg)    Labs & Radiologic Studies    CBC Recent Labs    01/05/18 0211 01/06/18 0232  WBC 6.7 5.8  HGB 12.4* 11.5*  HCT 36.9* 34.6*  MCV 89.1 89.2  PLT 156 017*   Basic Metabolic Panel Recent Labs    01/03/18 2255  NA 142  K 3.7  CL 107  CO2 27  GLUCOSE 139*  BUN 13  CREATININE 1.12  CALCIUM 9.7   Liver  Function Tests No results for input(s): AST, ALT, ALKPHOS, BILITOT, PROT, ALBUMIN in the last 72 hours. No results for input(s): LIPASE, AMYLASE in the last 72 hours. Cardiac Enzymes Recent Labs    01/03/18 2255 01/04/18 0059 01/04/18 0848  TROPONINI 5.83* 8.60* 8.18*   BNP Invalid input(s): POCBNP D-Dimer No results for input(s): DDIMER in the last 72 hours. Hemoglobin A1C Recent Labs    01/03/18 2255  HGBA1C 6.4*   Fasting Lipid Panel Recent Labs    01/05/18 0211  CHOL 130  HDL 34*  LDLCALC 84  TRIG 61  CHOLHDL 3.8   Thyroid Function Tests No results for input(s): TSH, T4TOTAL, T3FREE, THYROIDAB in the last 72 hours.  Invalid input(s): FREET3 _____________  No results found. Disposition   Pt is being discharged home today in good condition.  Follow-up Plans & Appointments    Follow-up Information    Revankar, Reita Cliche, MD Follow up on 01/25/2018.   Specialty:  Cardiology Why:  8:00 AM  Contact information: [address] [place]  [place] [address] [phone]  Discharge Instructions    AMB Referral to Cardiac Rehabilitation - Phase II   Complete by:  As directed    Diagnosis:  NSTEMI   Amb Referral to Cardiac Rehabilitation   Complete by:  As directed    Diagnosis:  NSTEMI   Diet - low sodium heart healthy   Complete by:  As directed    Increase activity slowly   Complete by:  As directed       Discharge Medications   Allergies as of 01/06/2018      Reactions   Ticagrelor Shortness Of Breath, Other (See Comments)   * Brilinta* Pt short of breath   Cephalexin Diarrhea   vomitting vomitting   Lisinopril    Difficulty breathing   Prednisone Other (See Comments)   GI Issues   Simvastatin Other (See Comments)   Unsure      Medication List    STOP taking these medications   pravastatin 40 MG tablet Commonly known as:  PRAVACHOL     TAKE these medications   ALPRAZolam 0.5 MG tablet Commonly known as:   XANAX TAKE 1/2 TABLET BY MOUTH EVERY 6 HOURS AS NEEDED FOR ANXIETY   aspirin 81 MG EC tablet Take 1 tablet (81 mg total) by mouth daily. Start taking on:  01/07/2018   benazepril 20 MG tablet Commonly known as:  LOTENSIN Take 20 mg by mouth 2 (two) times daily.   carvedilol 25 MG tablet Commonly known as:  COREG Take 1 tablet (25 mg total) by mouth 2 (two) times daily with a meal. What changed:    medication strength  how much to take   diltiazem 300 MG 24 hr capsule Commonly known as:  CARDIZEM CD Take 300 mg by mouth daily.   FLORAJEN ACIDOPHILUS PO Take 1 capsule by mouth daily.   glipiZIDE 5 MG 24 hr tablet Commonly known as:  GLUCOTROL XL TAKE 1 TABLET BY MOUTH DAILY   isosorbide mononitrate 30 MG 24 hr tablet Commonly known as:  IMDUR Take 1 tablet (30 mg total) by mouth daily. Start taking on:  01/07/2018   LANTUS SOLOSTAR 100 UNIT/ML Solostar Pen Generic drug:  Insulin Glargine INJECT 10 UNITS SUB-Q DAILY AT BEDTIME.   nitroGLYCERIN 0.4 MG SL tablet Commonly known as:  NITROSTAT Place 1 tablet (0.4 mg total) under the tongue every 5 (five) minutes as needed for chest pain.   NOVOFINE 32G X 6 MM Misc Generic drug:  Insulin Pen Needle USE AS DIRECTED.   rivaroxaban 20 MG Tabs tablet Commonly known as:  XARELTO Take 1 tablet (20 mg total) by mouth daily with supper. What changed:    medication strength  how much to take   rosuvastatin 20 MG tablet Commonly known as:  CRESTOR Take 1 tablet (20 mg total) by mouth daily at 6 PM.   tamsulosin 0.4 MG Caps capsule Commonly known as:  FLOMAX Take 0.4 mg by mouth daily after supper.   traMADol-acetaminophen 37.5-325 MG tablet Commonly known as:  ULTRACET Take 1 tablet by mouth every 6 (six) hours as needed for moderate pain.        Aspirin prescribed at discharge?  Yes High Intensity Statin Prescribed? (Lipitor 40-80mg  or Crestor 20-40mg ): Yes Beta Blocker Prescribed? Yes For EF <40%, was ACEI/ARB  Prescribed? Yes ADP Receptor Inhibitor Prescribed? (i.e. Plavix etc.-Includes Medically Managed Patients): No: on Xarelto for afib For EF <40%, Aldosterone Inhibitor Prescribed? No: EF> 40% Was EF assessed during THIS hospitalization? Yes Was Cardiac Rehab II  ordered? (Included Medically managed Patients): Yes   Outstanding Labs/Studies   Repeat fasting lipid panel in 6-8 weeks.   Duration of Discharge Encounter   Greater than 30 minutes including physician time.  Signed,  Lyda Jester, PA-C 01/06/2018, 12:34 PM

## 2018-01-06 NOTE — Progress Notes (Signed)
CARDIAC REHAB PHASE I   PRE:  Rate/Rhythm: 88 afib PVCs  BP:  Supine: 179/87  Sitting:   Standing:    SaO2:   MODE:  Ambulation: 800 ft   POST:  Rate/Rhythm: 109 afib bigeminy PVCs right after walk and then just PVCs  BP:  Supine:   Sitting: 199/95 machine, 170/80 manual  Standing:    SaO2:  0935-1010 Pt walked 800 ft with steady gait and no c/o CP. Encouraged pt to watch sodium with high BP and low EF. Gave walking for ex. Reviewed NTG use, has referral to [facility] program. Notified RN of bigeminy and BP.   Graylon Good, RN BSN  01/06/2018 10:04 AM

## 2018-01-09 ENCOUNTER — Telehealth: Payer: Self-pay | Admitting: Cardiology

## 2018-01-09 MED ORDER — BENAZEPRIL HCL 20 MG PO TABS: 20.0000 mg | ORAL_TABLET | Freq: Two times a day (BID) | ORAL | 6 refills | Status: DC

## 2018-01-09 NOTE — Telephone Encounter (Signed)
Marc [name] was put on a medication but not given a script. Please send in a script to [facility] for Benazepril 20 mg twice a day. Daughter also wants a call regarding him driving please.

## 2018-01-09 NOTE — Telephone Encounter (Signed)
Refill sent to [facility]. Patient's daughter, Santiago Glad, called to ask when patient can resume driving. Dr. Agustin Cree advised patient can drive one week after heart cath on Wednesday 01/11/18. Reminded her to closely monitor wrist and limit movement as instructed by hospital. Santiago Glad verbalized understanding. No further questions.

## 2018-01-20 DIAGNOSIS — Z87891 Personal history of nicotine dependence: Secondary | ICD-10-CM | POA: Diagnosis not present

## 2018-01-20 DIAGNOSIS — M65811 Other synovitis and tenosynovitis, right shoulder: Secondary | ICD-10-CM

## 2018-01-20 DIAGNOSIS — I6509 Occlusion and stenosis of unspecified vertebral artery: Secondary | ICD-10-CM

## 2018-01-20 DIAGNOSIS — I739 Peripheral vascular disease, unspecified: Secondary | ICD-10-CM | POA: Diagnosis not present

## 2018-01-20 DIAGNOSIS — R42 Dizziness and giddiness: Secondary | ICD-10-CM

## 2018-01-20 DIAGNOSIS — E119 Type 2 diabetes mellitus without complications: Secondary | ICD-10-CM | POA: Diagnosis not present

## 2018-01-20 DIAGNOSIS — I6523 Occlusion and stenosis of bilateral carotid arteries: Secondary | ICD-10-CM | POA: Diagnosis not present

## 2018-01-21 DIAGNOSIS — I6523 Occlusion and stenosis of bilateral carotid arteries: Secondary | ICD-10-CM | POA: Diagnosis not present

## 2018-01-21 DIAGNOSIS — I6509 Occlusion and stenosis of unspecified vertebral artery: Secondary | ICD-10-CM | POA: Diagnosis not present

## 2018-01-21 DIAGNOSIS — I739 Peripheral vascular disease, unspecified: Secondary | ICD-10-CM | POA: Diagnosis not present

## 2018-01-21 DIAGNOSIS — R42 Dizziness and giddiness: Secondary | ICD-10-CM | POA: Diagnosis not present

## 2018-01-25 ENCOUNTER — Ambulatory Visit: Payer: Medicare Other | Admitting: Cardiology

## 2018-01-26 ENCOUNTER — Encounter: Payer: Self-pay | Admitting: Cardiology

## 2018-01-26 ENCOUNTER — Ambulatory Visit (INDEPENDENT_AMBULATORY_CARE_PROVIDER_SITE_OTHER): Payer: Medicare Other | Admitting: Cardiology

## 2018-01-26 VITALS — BP 130/62 | HR 79 | Ht 72.0 in | Wt 189.0 lb

## 2018-01-26 DIAGNOSIS — I214 Non-ST elevation (NSTEMI) myocardial infarction: Secondary | ICD-10-CM | POA: Diagnosis not present

## 2018-01-26 DIAGNOSIS — I2510 Atherosclerotic heart disease of native coronary artery without angina pectoris: Secondary | ICD-10-CM

## 2018-01-26 DIAGNOSIS — Z794 Long term (current) use of insulin: Secondary | ICD-10-CM

## 2018-01-26 DIAGNOSIS — E119 Type 2 diabetes mellitus without complications: Secondary | ICD-10-CM

## 2018-01-26 DIAGNOSIS — E782 Mixed hyperlipidemia: Secondary | ICD-10-CM | POA: Diagnosis not present

## 2018-01-26 DIAGNOSIS — I10 Essential (primary) hypertension: Secondary | ICD-10-CM

## 2018-01-26 DIAGNOSIS — I4891 Unspecified atrial fibrillation: Secondary | ICD-10-CM | POA: Diagnosis not present

## 2018-01-26 NOTE — Patient Instructions (Signed)
Medication Instructions:  Your physician has recommended you make the following change in your medication:  CHANGE hydralazine 1/2 tablet (12.5 mg) three times daily  Labwork: None  Testing/Procedures: None  Follow-Up: Your physician recommends that you schedule a follow-up appointment in: 1 month  Any Other Special Instructions Will Be Listed Below (If Applicable).     If you need a refill on your cardiac medications before your next appointment, please call your pharmacy.   [place], RN, BSN

## 2018-01-26 NOTE — Progress Notes (Signed)
Cardiology Office Note:    Date:  01/26/2018   ID:  Marc [name], DOB 1941-04-01, MRN [id number]  PCP:  Raina Mina., MD  Cardiologist:  Jenean Lindau, MD   Referring MD: Raina Mina., MD    ASSESSMENT:    1. Non-ST elevation (NSTEMI) myocardial infarction ([place])   2. Essential hypertension   3. Coronary artery disease involving native coronary artery of native heart without angina pectoris   4. Atrial fibrillation, unspecified type ([place])   5. Type 2 diabetes mellitus without complication, with long-term current use of insulin ([place])   6. Mixed hyperlipidemia    PLAN:    In order of problems listed above:  1. Secondary prevention stressed with the patient.  Importance of compliance with diet and medications stressed and he vocalized understanding. 2. His blood pressure is borderline and in view of dizziness have asked him to cut hydralazine dose to half dose 3 times daily and he vocalized understanding. 3. Hospital records were reviewed extensively and questions were answered to his satisfaction.  Coronary angiography report was discussed. 4. He will be seen in follow-up appointment in a month or earlier if he has any concerns.  He was advised to come in fasting as we will do blood work including lipids.  His daughter will keep a track of his blood pressures and call us if there are any concerns.  This evaluation was 30 minutes.   Medication Adjustments/Labs and Tests Ordered: Current medicines are reviewed at length with the patient today.  Concerns regarding medicines are outlined above.  No orders of the defined types were placed in this encounter.  No orders of the defined types were placed in this encounter.    No chief complaint on file.    History of Present Illness:    Marc [name] is a 77 y.o. male.  The patient has known coronary artery disease.  He went to [facility] with symptoms of hypertensive emergency and the daughter mentions to me that  CT scanning and MRI of the head and the brain did not reveal any stroke.  Subsequently his troponin was positive and he was transferred to [facility].  Patient has multiple nonobstructive coronary artery disease.  There was a thrombus in the right coronary artery and an attempt was not made as revascularization was thought to be more risky has been beneficial.  Patient was subsequently initiated on aspirin and continued on Xarelto.  He is done fine he denies any problems at this time.  No chest pain orthopnea or PND.  At the time of my evaluation, the patient is alert awake oriented and in no distress.  His daughter accompanies him.  She mentions to me that he might feel a little dizzy when he takes hydralazine in the afternoon.  Past Medical History:  Diagnosis Date  . AAA (abdominal aortic aneurysm) (HCC)    small. An abdominal ultrasound in 2011 showed no aneurysm  . ACS (acute coronary syndrome) ([place])   . Arthritis   . Atrial fibrillation ([place])    permanent  . Coronary artery disease   . Diabetes mellitus without complication ([place])   . Hyperlipidemia   . Hypertension   . Mitral regurgitation    with MVP. Mild to moderate  . Renal artery stenosis ([place])   . Renal artery stenosis in 1 of 2 vessels ([place]) 2006   subtotal occlusion of right renal artery. Treated medically.   . Sexual dysfunction  Past Surgical History:  Procedure Laterality Date  . CARDIAC CATHETERIZATION  2006   showed an occluded Left circumflex with collaterals as well as 60% mid right coronary artery stenosis  . CARDIAC CATHETERIZATION  02/2011   LM: 20%, LAD: calcified 20%, LCX: occluded distal AV groove supplying a small area, RCA: calcified with 50-60% proximal-mid disease  . CIRCUMCISION    . FOOT SURGERY Right   . LEFT HEART CATH AND CORONARY ANGIOGRAPHY N/A 01/04/2018   Procedure: LEFT HEART CATH AND CORONARY ANGIOGRAPHY;  Surgeon: Wellington Hampshire, MD;  Location: [place];  Service:  Cardiovascular;  Laterality: N/A;    Current Medications: Current Meds  Medication Sig  . ALPRAZolam (XANAX) 0.5 MG tablet TAKE 1/2 TABLET BY MOUTH EVERY 6 HOURS AS NEEDED FOR ANXIETY  . aspirin EC 81 MG EC tablet Take 1 tablet (81 mg total) by mouth daily.  . carvedilol (COREG) 25 MG tablet Take 1 tablet (25 mg total) by mouth 2 (two) times daily with a meal.  . diltiazem (CARDIZEM CD) 300 MG 24 hr capsule Take 300 mg by mouth daily.   Marland Kitchen glipiZIDE (GLUCOTROL XL) 5 MG 24 hr tablet TAKE 1 TABLET BY MOUTH DAILY  . hydrALAZINE (APRESOLINE) 25 MG tablet Take 0.5 tablets by mouth 3 (three) times daily.  . Insulin Glargine (LANTUS SOLOSTAR) 100 UNIT/ML Solostar Pen INJECT 10 UNITS SUB-Q DAILY AT BEDTIME.  . Insulin Pen Needle (NOVOFINE) 32G X 6 MM MISC USE AS DIRECTED.  Marland Kitchen isosorbide mononitrate (IMDUR) 30 MG 24 hr tablet Take 1 tablet (30 mg total) by mouth daily.  . nitroGLYCERIN (NITROSTAT) 0.4 MG SL tablet Place 1 tablet (0.4 mg total) under the tongue every 5 (five) minutes as needed for chest pain.  . pantoprazole (PROTONIX) 40 MG tablet Take 1 tablet by mouth daily.  . pravastatin (PRAVACHOL) 40 MG tablet Take 40 mg by mouth at bedtime.  . Rivaroxaban (XARELTO) 15 MG TABS tablet Take 15 mg by mouth at bedtime.  . rosuvastatin (CRESTOR) 20 MG tablet Take 1 tablet (20 mg total) by mouth daily at 6 PM.  . sertraline (ZOLOFT) 25 MG tablet Take 1 tablet by mouth daily.  . tamsulosin (FLOMAX) 0.4 MG CAPS capsule Take 0.4 mg by mouth daily after supper.   . traMADol-acetaminophen (ULTRACET) 37.5-325 MG per tablet Take 1 tablet by mouth every 6 (six) hours as needed for moderate pain.   . [DISCONTINUED] Lactobacillus (FLORAJEN ACIDOPHILUS PO) Take 1 capsule by mouth daily.     Allergies:   Ticagrelor; Cephalexin; Lisinopril; Prednisone; and Simvastatin   Social History   Socioeconomic History  . Marital status: Married    Spouse name: Not on file  . Number of children: Not on file  . Years  of education: Not on file  . Highest education level: Not on file  Occupational History  . 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 Financial resource strain: Not on file  . Food insecurity:    Worry: Not on file    Inability: Not on file  . Transportation needs:    Medical: Not on file    Non-medical: Not on file  Tobacco Use  . Smoking status: Former Smoker    Packs/day: 0.10    Years: 30.00    Pack years: 3.00    Types: Cigarettes    Last attempt to quit: 12/15/1983    Years since quitting: 34.1  . Smokeless tobacco: Former Systems developer  Types: [place] Ser    Quit date: 09/07/1983  Substance and Sexual Activity  . Alcohol use: No  . Drug use: No  . Sexual activity: Not on file  Lifestyle  . Physical activity:    Days per week: Not on file    Minutes per session: Not on file  . Stress: Not on file  Relationships  . Social connections:    Talks on phone: Not on file    Gets together: Not on file    Attends religious service: Not on file    Active member of club or organization: Not on file    Attends meetings of clubs or organizations: Not on file    Relationship status: Not on file  Other Topics Concern  . Not on file  Social History Narrative  . Not on file     Family History: The patient's family history is not on file.  ROS:   Please see the history of present illness.    All other systems reviewed and are negative.  EKGs/Labs/Other Studies Reviewed:    The following studies were reviewed today: I reviewed records from [facility] extensively and discussed with the patient at length.   Recent Labs: 01/03/2018: BUN 13; Creatinine, Ser 1.12; Potassium 3.7; Sodium 142 01/06/2018: Hemoglobin 11.5; Platelets 137  Recent Lipid Panel    Component Value Date/Time   CHOL 130 01/05/2018 0211   TRIG 61 01/05/2018 0211   HDL 34 (L) 01/05/2018 0211   CHOLHDL 3.8 01/05/2018 0211   VLDL 12 01/05/2018 0211   LDLCALC 84 01/05/2018 0211     Physical Exam:    VS:  BP 130/62 (BP Location: Left Arm, Patient Position: Sitting, Cuff Size: Normal)   Pulse 79   Ht 6' (1.829 m)   Wt 189 lb (85.7 kg)   SpO2 98%   BMI 25.63 kg/m     Wt Readings from Last 3 Encounters:  01/26/18 189 lb (85.7 kg)  01/06/18 201 lb 9.6 oz (91.4 kg)  10/25/17 197 lb (89.4 kg)     GEN: Patient is in no acute distress HEENT: Normal NECK: No JVD; No carotid bruits LYMPHATICS: No lymphadenopathy CARDIAC: Hear sounds regular, 2/6 systolic murmur at the apex. RESPIRATORY:  Clear to auscultation without rales, wheezing or rhonchi  ABDOMEN: Soft, non-tender, non-distended MUSCULOSKELETAL:  No edema; No deformity  SKIN: Warm and dry NEUROLOGIC:  Alert and oriented x 3 PSYCHIATRIC:  Normal affect   Signed, Jenean Lindau, MD  01/26/2018 11:04 AM    [place]

## 2018-02-06 ENCOUNTER — Other Ambulatory Visit: Payer: Self-pay

## 2018-02-06 DIAGNOSIS — I6509 Occlusion and stenosis of unspecified vertebral artery: Secondary | ICD-10-CM

## 2018-02-08 ENCOUNTER — Encounter: Payer: Self-pay | Admitting: Specialist

## 2018-02-13 ENCOUNTER — Ambulatory Visit: Payer: Medicare Other | Admitting: Vascular Surgery

## 2018-02-13 ENCOUNTER — Other Ambulatory Visit: Payer: Self-pay

## 2018-02-13 ENCOUNTER — Encounter: Payer: Self-pay | Admitting: Vascular Surgery

## 2018-02-13 ENCOUNTER — Ambulatory Visit (HOSPITAL_COMMUNITY)
Admission: RE | Admit: 2018-02-13 | Discharge: 2018-02-13 | Disposition: A | Discharge status: Home / Self Care | Payer: Medicare Other | Source: Ambulatory Visit | Attending: Vascular Surgery | Admitting: Vascular Surgery

## 2018-02-13 DIAGNOSIS — I6523 Occlusion and stenosis of bilateral carotid arteries: Secondary | ICD-10-CM | POA: Insufficient documentation

## 2018-02-13 DIAGNOSIS — R42 Dizziness and giddiness: Secondary | ICD-10-CM | POA: Diagnosis not present

## 2018-02-13 DIAGNOSIS — I6501 Occlusion and stenosis of right vertebral artery: Secondary | ICD-10-CM | POA: Diagnosis not present

## 2018-02-13 DIAGNOSIS — I6509 Occlusion and stenosis of unspecified vertebral artery: Secondary | ICD-10-CM | POA: Diagnosis not present

## 2018-02-13 NOTE — Progress Notes (Signed)
New Carotid Patient  Requested by:  Raina Mina., MD [address] [place], [place] [address]  Reason for consultation: right vertebral artery occlusion   History of Present Illness   Marc [name] is a 77 y.o. (23-Apr-1941) male who presents with chief complaint: dizziness.  Patient had an episode in May when he had severe dizziness when he got up in the middle of the night to urinate.  He had difficulty walking to the bathroom.  He had no other neurologic sx at the time.  This has essentially resolved at this point.  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  Patient has no prior history of TIA or stroke symptom.  The patient has never had amaurosis fugax or monocular blindness.  The patient has never had facial drooping or hemiplegia.  The patient has never had receptive or expressive aphasia.   The patient's previous neurologic deficits have resolved.  The patient's risks factors for carotid disease include: HLD, HTN, and prior smoker.  Past Medical History:  Diagnosis Date  . AAA (abdominal aortic aneurysm) (HCC)    small. An abdominal ultrasound in 2011 showed no aneurysm  . ACS (acute coronary syndrome) ([place])   . Arthritis   . Atrial fibrillation ([place])    permanent  . Coronary artery disease   . Diabetes mellitus without complication ([place])   . Hyperlipidemia   . Hypertension   . Mitral regurgitation    with MVP. Mild to moderate  . Myocardial infarction ([place])   . Renal artery stenosis ([place])   . Renal artery stenosis in 1 of 2 vessels ([place]) 2006   subtotal occlusion of right renal artery. Treated medically.   . Sexual dysfunction     Past Surgical History:  Procedure Laterality Date  . CARDIAC CATHETERIZATION  2006   showed an occluded Left circumflex with collaterals as well as 60% mid right coronary artery stenosis  . CARDIAC CATHETERIZATION  02/2011   LM: 20%, LAD: calcified 20%, LCX: occluded distal AV  groove supplying a small area, RCA: calcified with 50-60% proximal-mid disease  . CIRCUMCISION    . FOOT SURGERY Right   . LEFT HEART CATH AND CORONARY ANGIOGRAPHY N/A 01/04/2018   Procedure: LEFT HEART CATH AND CORONARY ANGIOGRAPHY;  Surgeon: Wellington Hampshire, MD;  Location: [place];  Service: Cardiovascular;  Laterality: N/A;    Social History   Socioeconomic History  . Marital status: Married    Spouse name: Not on file  . Number of children: Not on file  . Years of education: Not on file  . Highest education level: Not on file  Occupational History  . 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 Financial resource strain: Not on file  . Food insecurity:    Worry: Not on file    Inability: Not on file  . Transportation needs:    Medical: Not on file    Non-medical: Not on file  Tobacco Use  . Smoking status: Former Smoker    Packs/day: 0.10    Years: 30.00    Pack years: 3.00    Types: Cigarettes    Last attempt to quit: 12/15/1983    Years since quitting: 34.1  . Smokeless tobacco: Former Systems developer    Types: [place] date: 09/07/1983  Substance and Sexual Activity  . Alcohol use: No  . Drug  use: No  . Sexual activity: Not on file  Lifestyle  . Physical activity:    Days per week: Not on file    Minutes per session: Not on file  . Stress: Not on file  Relationships  . Social connections:    Talks on phone: Not on file    Gets together: Not on file    Attends religious service: Not on file    Active member of club or organization: Not on file    Attends meetings of clubs or organizations: Not on file    Relationship status: Not on file  . Intimate partner violence:    Fear of current or ex partner: Not on file    Emotionally abused: Not on file    Physically abused: Not on file    Forced sexual activity: Not on file  Other Topics Concern  . Not on file  Social History Narrative  . Not on file    Family History  Problem  Relation Age of Onset  . Heart disease Sister     Current Outpatient Medications  Medication Sig Dispense Refill  . ALPRAZolam (XANAX) 0.5 MG tablet TAKE 1/2 TABLET BY MOUTH EVERY 6 HOURS AS NEEDED FOR ANXIETY    . diltiazem (CARDIZEM CD) 300 MG 24 hr capsule Take 300 mg by mouth daily.     Marland Kitchen glipiZIDE (GLUCOTROL XL) 5 MG 24 hr tablet TAKE 1 TABLET BY MOUTH DAILY    . hydrALAZINE (APRESOLINE) 25 MG tablet Take 0.5 tablets by mouth 3 (three) times daily.    . Insulin Glargine (LANTUS SOLOSTAR) 100 UNIT/ML Solostar Pen INJECT 10 UNITS SUB-Q DAILY AT BEDTIME.    . Insulin Pen Needle (NOVOFINE) 32G X 6 MM MISC USE AS DIRECTED.    Marland Kitchen isosorbide mononitrate (IMDUR) 30 MG 24 hr tablet Take 1 tablet (30 mg total) by mouth daily. (Patient taking differently: Take 15 mg by mouth daily. ) 30 tablet 5  . nitroGLYCERIN (NITROSTAT) 0.4 MG SL tablet Place 1 tablet (0.4 mg total) under the tongue every 5 (five) minutes as needed for chest pain. 25 tablet 2  . pantoprazole (PROTONIX) 40 MG tablet Take 1 tablet by mouth daily.    . pravastatin (PRAVACHOL) 40 MG tablet Take 40 mg by mouth at bedtime.    . rosuvastatin (CRESTOR) 20 MG tablet Take 1 tablet (20 mg total) by mouth daily at 6 PM. 30 tablet 5  . sertraline (ZOLOFT) 25 MG tablet Take 1 tablet by mouth daily.    . tamsulosin (FLOMAX) 0.4 MG CAPS capsule Take 0.4 mg by mouth daily after supper.     . traMADol-acetaminophen (ULTRACET) 37.5-325 MG per tablet Take 1 tablet by mouth every 6 (six) hours as needed for moderate pain.     Marland Kitchen aspirin EC 81 MG EC tablet Take 1 tablet (81 mg total) by mouth daily. (Patient not taking: Reported on 02/13/2018)    . carvedilol (COREG) 25 MG tablet Take 1 tablet (25 mg total) by mouth 2 (two) times daily with a meal. (Patient not taking: Reported on 02/13/2018) 60 tablet 5  . Rivaroxaban (XARELTO) 15 MG TABS tablet Take 15 mg by mouth at bedtime.     No current facility-administered medications for this visit.      Allergies  Allergen Reactions  . Ticagrelor Shortness Of Breath and Other (See Comments)    * Brilinta* Pt short of breath  . Cephalexin Diarrhea    vomitting vomitting   . Lisinopril  Difficulty breathing   . Prednisone Other (See Comments)    GI Issues  . Simvastatin Other (See Comments)    Unsure    REVIEW OF SYSTEMS (negative unless checked):   Cardiac:  []  Chest pain or chest pressure? []  Shortness of breath upon activity? []  Shortness of breath when lying flat? [x]  Irregular heart rhythm?  Vascular:  []  Pain in calf, thigh, or hip brought on by walking? []  Pain in feet at night that wakes you up from your sleep? []  Blood clot in your veins? []  Leg swelling?  Pulmonary:  []  Oxygen at home? []  Productive cough? []  Wheezing?  Neurologic:  []  Sudden weakness in arms or legs? []  Sudden numbness in arms or legs? []  Sudden onset of difficult speaking or slurred speech? []  Temporary loss of vision in one eye? [x]  Problems with dizziness?  Gastrointestinal:  [x]  Blood in stool? []  Vomited blood?  Genitourinary:  []  Burning when urinating? []  Blood in urine?  Psychiatric:  []  Major depression  Hematologic:  []  Bleeding problems? []  Problems with blood clotting?  Dermatologic:  []  Rashes or ulcers?  Constitutional:  []  Fever or chills?  Ear/Nose/Throat:  []  Change in hearing? []  Nose bleeds? []  Sore throat?  Musculoskeletal:  []  Back pain? []  Joint pain? []  Muscle pain?   For VQI Use Only   PRE-ADM LIVING Home  AMB STATUS Ambulatory  CAD Sx None  PRIOR CHF None  STRESS TEST No    Physical Examination     Vitals:   02/13/18 1057 02/13/18 1101  BP: 138/72 137/68  Pulse: 74   Resp: 18   SpO2: 98%   Weight: 191 lb 14.4 oz (87 kg)   Height: 6' (1.829 m)    Body mass index is 26.03 kg/m.  General Alert, O x 3, WD, NAD  Head /AT,    Ear/Nose/ Throat Hearing grossly decreased, nares without erythema or drainage,  oropharynx without Erythema or Exudate, Mallampati score: 3,   Eyes PERRLA, EOMI,    Neck Supple, mid-line trachea,    Pulmonary Sym exp, good B air movt, CTA B  Cardiac RRR, Nl S1, S2, no Murmurs, No rubs, No S3,S4  Vascular Vessel Right Left  Radial Palpable Palpable  Brachial Palpable Palpable  Carotid Palpable, No Bruit Palpable, No Bruit  Aorta Not palpable N/A  Femoral Palpable Palpable  Popliteal Not palpable Not palpable  PT Not palpable Not palpable  DP Faintly palpable Faintly palpable    Gastro- intestinal soft, non-distended, non-tender to palpation, No guarding or rebound, no HSM, no masses, no CVAT B, No palpable prominent aortic pulse,    Musculo- skeletal M/S 5/5 throughout  , Extremities without ischemic changes  , No edema present, No visible varicosities , No Lipodermatosclerosis present  Neurologic Cranial nerves 2-12 intact , Pain and light touch intact in extremities , Motor exam as listed above  Psychiatric Judgement intact, Mood & affect appropriate for pt's clinical situation  Dermatologic See M/S exam for extremity exam, No rashes otherwise noted  Lymphatic  Palpable lymph nodes: None     Non-invasive Vascular Imaging   B Carotid Duplex (02/13/2018):  Marland Kitchen R ICA stenosis:  1-39% . R VA: occluded distally . L ICA stenosis:  40-59% . L VA: patent and antegrade    Radiology     01/20/18 MR Head: no acute infarct, small remote infarcts in L cerebellum and left pons.  Neck MRA: occluded R VA.  Large dominant L VA.  Poor visualization of  originate.  B Carotid < 50%   Outside Studies/Documentation   15 pages of outside documents were reviewed including: included PCP records, MRI studies.   Medical Decision Making   ABAYOMI PATTISON is a 77 y.o. 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 . I discussed in depth with the patient the nature of atherosclerosis, and emphasized the importance of maximal medical management including strict control of blood pressure, blood glucose, and lipid levels, obtaining regular exercise, antiplatelet agents, and cessation of smoking.    The patient is currently on a statin: Crestor.   The patient is currently on an anti-platelet: ASA.  The patient is aware that without maximal medical management the underlying atherosclerotic disease process will progress, limiting the benefit of any interventions.  Pt will follow up in 4-6 weeks with the CTA.  Thank you for allowing [place] to participate in this patient's care.   Adele Barthel, MD, FACS [facility] Office: [phone] Pager: [phone]  02/13/2018, 11:08 AM

## 2018-02-27 ENCOUNTER — Ambulatory Visit: Payer: Medicare Other | Admitting: Cardiology

## 2018-03-21 ENCOUNTER — Ambulatory Visit (INDEPENDENT_AMBULATORY_CARE_PROVIDER_SITE_OTHER): Payer: Medicare Other | Admitting: Cardiology

## 2018-03-21 ENCOUNTER — Encounter: Payer: Self-pay | Admitting: Cardiology

## 2018-03-21 VITALS — BP 136/74 | HR 77 | Ht 72.0 in | Wt 192.0 lb

## 2018-03-21 DIAGNOSIS — E782 Mixed hyperlipidemia: Secondary | ICD-10-CM

## 2018-03-21 DIAGNOSIS — I340 Nonrheumatic mitral (valve) insufficiency: Secondary | ICD-10-CM | POA: Diagnosis not present

## 2018-03-21 DIAGNOSIS — I10 Essential (primary) hypertension: Secondary | ICD-10-CM

## 2018-03-21 DIAGNOSIS — I2510 Atherosclerotic heart disease of native coronary artery without angina pectoris: Secondary | ICD-10-CM | POA: Diagnosis not present

## 2018-03-21 DIAGNOSIS — E119 Type 2 diabetes mellitus without complications: Secondary | ICD-10-CM

## 2018-03-21 DIAGNOSIS — Z794 Long term (current) use of insulin: Secondary | ICD-10-CM

## 2018-03-21 DIAGNOSIS — I4891 Unspecified atrial fibrillation: Secondary | ICD-10-CM | POA: Diagnosis not present

## 2018-03-21 NOTE — Progress Notes (Signed)
Cardiology Office Note:    Date:  03/21/2018   ID:  Marc [name], DOB [birth year], MRN [id number]  PCP:  Raina Mina., MD  Cardiologist:  Jenean Lindau, MD   Referring MD: Raina Mina., MD    ASSESSMENT:    1. Atrial fibrillation, unspecified type ([place])   2. Coronary artery disease involving native coronary artery of native heart without angina pectoris   3. Essential hypertension   4. Mitral valve insufficiency, unspecified etiology   5. Mixed hyperlipidemia   6. Type 2 diabetes mellitus without complication, with long-term current use of insulin (HCC)    PLAN:    In order of problems listed above:  1. Secondary prevention stressed with the patient.  Importance of compliance with diet and medications stressed and he vocalized understanding.  Importance of exercise stressed to him.  His blood pressure is stable.  Diet was discussed for dyslipidemia.  I mentioned to the patient that his blood work will be followed by his primary care physician.  He will go home and fingers suggestive of his cardiac medications.  His list is not accurate at this time. 2. Patient will be seen in follow-up appointment in 6 months or earlier if the patient has any concerns    Medication Adjustments/Labs and Tests Ordered: Current medicines are reviewed at length with the patient today.  Concerns regarding medicines are outlined above.  No orders of the defined types were placed in this encounter.  No orders of the defined types were placed in this encounter.    No chief complaint on file.    History of Present Illness:    Marc [name] is a 77 y.o. male.  Patient has known coronary artery disease.  He recently had a myocardial infarction and was at the hospital.  Subsequently he underwent treatment for this and was released.  Since then he is done fine.  No chest pain orthopnea or PND.  Unfortunately has not brought a list of his medications today.  He denies any chest pain orthopnea  or PND.  At the time of my evaluation, the patient is alert awake oriented and in no distress.  He has atrial fibrillation and his anticoagulation with warfarin is managed by his primary care physician.  Past Medical History:  Diagnosis Date  . AAA (abdominal aortic aneurysm) (HCC)    small. An abdominal ultrasound in 2011 showed no aneurysm  . ACS (acute coronary syndrome) ([place])   . Arthritis   . Atrial fibrillation ([place])    permanent  . Coronary artery disease   . Diabetes mellitus without complication ([place])   . Hyperlipidemia   . Hypertension   . Mitral regurgitation    with MVP. Mild to moderate  . Myocardial infarction ([place])   . Renal artery stenosis ([place])   . 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 Treated medically.   . Sexual dysfunction     Past Surgical History:  Procedure Laterality Date  . CARDIAC CATHETERIZATION  2006   showed an occluded Left circumflex with collaterals as well as 60% mid right coronary artery stenosis  . CARDIAC CATHETERIZATION  02/2011   LM: 20%, LAD: calcified 20%, LCX: occluded distal AV groove supplying a small area, RCA: calcified with 50-60% proximal-mid disease  . CIRCUMCISION    . FOOT SURGERY Right   . LEFT HEART CATH AND CORONARY ANGIOGRAPHY N/A 01/04/2018   Procedure: LEFT HEART CATH  AND CORONARY ANGIOGRAPHY;  Surgeon: Wellington Hampshire, MD;  Location: [facility];  Service: Cardiovascular;  Laterality: N/A;    Current Medications: Current Meds  Medication Sig  . ALPRAZolam (XANAX) 0.5 MG tablet TAKE 1/2 TABLET BY MOUTH EVERY 6 HOURS AS NEEDED FOR ANXIETY  . carvedilol (COREG) 25 MG tablet Take 1 tablet (25 mg total) by mouth 2 (two) times daily with a meal.  . diltiazem (CARDIZEM CD) 300 MG 24 hr capsule Take 300 mg by mouth daily.   Marland Kitchen glipiZIDE (GLUCOTROL XL) 5 MG 24 hr tablet TAKE 1 TABLET BY MOUTH DAILY  . hydrALAZINE (APRESOLINE) 25 MG tablet Take 0.5 tablets by mouth  3 (three) times daily.  . Insulin Glargine (LANTUS SOLOSTAR) 100 UNIT/ML Solostar Pen INJECT 10 UNITS SUB-Q DAILY AT BEDTIME.  . Insulin Pen Needle (NOVOFINE) 32G X 6 MM MISC USE AS DIRECTED.  Marland Kitchen isosorbide mononitrate (IMDUR) 30 MG 24 hr tablet Take 1 tablet (30 mg total) by mouth daily. (Patient taking differently: Take 15 mg by mouth daily. )  . nitroGLYCERIN (NITROSTAT) 0.4 MG SL tablet Place 1 tablet (0.4 mg total) under the tongue every 5 (five) minutes as needed for chest pain.  . pravastatin (PRAVACHOL) 40 MG tablet Take 40 mg by mouth at bedtime.  . rosuvastatin (CRESTOR) 20 MG tablet Take 1 tablet (20 mg total) by mouth daily at 6 PM.  . sertraline (ZOLOFT) 25 MG tablet Take 1 tablet by mouth daily.  . tamsulosin (FLOMAX) 0.4 MG CAPS capsule Take 0.4 mg by mouth daily after supper.   . traMADol-acetaminophen (ULTRACET) 37.5-325 MG per tablet Take 1 tablet by mouth every 6 (six) hours as needed for moderate pain.   Marland Kitchen warfarin (COUMADIN) 5 MG tablet Take 1 tablet by mouth daily.     Allergies:   Ticagrelor; Cephalexin; Lisinopril; Prednisone; and Simvastatin   Social History   Socioeconomic History  . Marital status: Married    Spouse name: Not on file  . Number of children: Not on file  . Years of education: Not on file  . Highest education level: Not on file  Occupational History  . 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 Financial resource strain: Not on file  . Food insecurity:    Worry: Not on file    Inability: Not on file  . Transportation needs:    Medical: Not on file    Non-medical: Not on file  Tobacco Use  . Smoking status: Former Smoker    Packs/day: 0.10    Years: 30.00    Pack years: 3.00    Types: Cigarettes    Last attempt to quit: 12/15/1983    Years since quitting: 34.2  . Smokeless tobacco: Former Systems developer    Types: [place] date: 09/07/1983  Substance and Sexual Activity  . Alcohol use: No  . Drug use: No  . Sexual  activity: Not on file  Lifestyle  . Physical activity:    Days per week: Not on file    Minutes per session: Not on file  . Stress: Not on file  Relationships  . Social connections:    Talks on phone: Not on file    Gets together: Not on file    Attends religious service: Not on file    Active member of club or organization: Not on file    Attends meetings of clubs or organizations: Not on file    Relationship  status: Not on file  Other Topics Concern  . Not on file  Social History Narrative  . Not on file     Family History: The patient's family history includes Heart disease in his sister.  ROS:   Please see the history of present illness.    All other systems reviewed and are negative.  EKGs/Labs/Other Studies Reviewed:    The following studies were reviewed today: I discussed my findings with the patient at extensive length.   Recent Labs: 01/03/2018: BUN 13; Creatinine, Ser 1.12; Potassium 3.7; Sodium 142 01/06/2018: Hemoglobin 11.5; Platelets 137  Recent Lipid Panel    Component Value Date/Time   CHOL 130 01/05/2018 0211   TRIG 61 01/05/2018 0211   HDL 34 (L) 01/05/2018 0211   CHOLHDL 3.8 01/05/2018 0211   VLDL 12 01/05/2018 0211   LDLCALC 84 01/05/2018 0211    Physical Exam:    VS:  BP 136/74 (BP Location: Right Arm, Patient Position: Sitting, Cuff Size: Normal)   Pulse 77   Ht 6' (1.829 m)   Wt 192 lb (87.1 kg)   SpO2 98%   BMI 26.04 kg/m     Wt Readings from Last 3 Encounters:  03/21/18 192 lb (87.1 kg)  02/13/18 191 lb 14.4 oz (87 kg)  01/26/18 189 lb (85.7 kg)     GEN: Patient is in no acute distress HEENT: Normal NECK: No JVD; No carotid bruits LYMPHATICS: No lymphadenopathy CARDIAC: Hear sounds regular, 2/6 systolic murmur at the apex. RESPIRATORY:  Clear to auscultation without rales, wheezing or rhonchi  ABDOMEN: Soft, non-tender, non-distended MUSCULOSKELETAL:  No edema; No deformity  SKIN: Warm and dry NEUROLOGIC:  Alert and  oriented x 3 PSYCHIATRIC:  Normal affect   Signed, Jenean Lindau, MD  03/21/2018 3:37 PM    [facility]

## 2018-03-21 NOTE — Patient Instructions (Signed)
Medication Instructions:  Your physician recommends that you continue on your current medications as directed. Please refer to the Current Medication list given to you today.  Labwork: None  Testing/Procedures: None  Follow-Up: Your physician recommends that you schedule a follow-up appointment in: 6 months  Any Other Special Instructions Will Be Listed Below (If Applicable).     If you need a refill on your cardiac medications before your next appointment, please call your pharmacy.   [facility]  Laurent Cargile A, RN, BSN  

## 2018-03-23 ENCOUNTER — Telehealth: Payer: Self-pay | Admitting: Cardiology

## 2018-03-23 ENCOUNTER — Other Ambulatory Visit: Payer: Self-pay

## 2018-03-23 MED ORDER — ISOSORBIDE MONONITRATE ER 30 MG PO TB24: 15.0000 mg | ORAL_TABLET | Freq: Every day | ORAL | 0 refills | Status: AC

## 2018-03-23 NOTE — Telephone Encounter (Signed)
Marc [name] changed his meds and took him off his Pantoprazole $)mg she said this may be causing him to feel bad.Marland KitchenFYI

## 2018-03-23 NOTE — Telephone Encounter (Signed)
Okay thank you

## 2018-03-28 NOTE — Progress Notes (Signed)
Established Vertebral Patient   History of Present Illness   Marc [name] is a 77 y.o. (March 01, 1941) male who presents with chief complaint: dizziness.  Previous carotid studies demonstrated: RICA <14% stenosis, LICA <43% stenosis.  MRA suggest R VA occlusion.  I recommended getting a CTA neck to evaluate the vertebral arteries given the significant false positive rate of MRA.  Patient has no history of TIA or stroke symptom.  The patient has never had amaurosis fugax or monocular blindness.  The patient has never had facial drooping or hemiplegia.  The patient has never had receptive or expressive aphasia.     The patient's PMH, PSH, SH, and FamHx were reviewed on 03/29/18 are unchanged from 02/13/18.  Current Outpatient Medications  Medication Sig Dispense Refill  . ALPRAZolam (XANAX) 0.5 MG tablet TAKE 1/2 TABLET BY MOUTH EVERY 6 HOURS AS NEEDED FOR ANXIETY    . carvedilol (COREG) 25 MG tablet Take 1 tablet (25 mg total) by mouth 2 (two) times daily with a meal. 60 tablet 5  . diltiazem (CARDIZEM CD) 300 MG 24 hr capsule Take 300 mg by mouth daily.     Marland Kitchen glipiZIDE (GLUCOTROL XL) 5 MG 24 hr tablet TAKE 1 TABLET BY MOUTH DAILY    . hydrALAZINE (APRESOLINE) 25 MG tablet Take 0.5 tablets by mouth 3 (three) times daily.    . Insulin Glargine (LANTUS SOLOSTAR) 100 UNIT/ML Solostar Pen INJECT 10 UNITS SUB-Q DAILY AT BEDTIME.    . Insulin Pen Needle (NOVOFINE) 32G X 6 MM MISC USE AS DIRECTED.    Marland Kitchen isosorbide mononitrate (IMDUR) 30 MG 24 hr tablet Take 0.5 tablets (15 mg total) by mouth daily. 30 tablet 0  . nitroGLYCERIN (NITROSTAT) 0.4 MG SL tablet Place 1 tablet (0.4 mg total) under the tongue every 5 (five) minutes as needed for chest pain. 25 tablet 2  . pantoprazole (PROTONIX) 40 MG tablet Take 1 tablet by mouth daily.    . rosuvastatin (CRESTOR) 20 MG tablet Take 1 tablet (20 mg total) by mouth daily at 6 PM. 30 tablet 5  . sertraline (ZOLOFT) 25 MG tablet Take 1 tablet by mouth  daily.    . tamsulosin (FLOMAX) 0.4 MG CAPS capsule Take 0.4 mg by mouth daily after supper.     . traMADol-acetaminophen (ULTRACET) 37.5-325 MG per tablet Take 1 tablet by mouth every 6 (six) hours as needed for moderate pain.     Marland Kitchen warfarin (COUMADIN) 5 MG tablet Take 1 tablet by mouth daily.     No current facility-administered medications for this visit.     On ROS today: no discoordination, no stroke sx   Physical Examination   Vitals:   03/29/18 1430  BP: 135/68  Pulse: 68  Resp: 16  Temp: 97.9 F (36.6 C)  TempSrc: Oral  SpO2: 96%  Weight: 192 lb 14.4 oz (87.5 kg)  Height: 6' (1.829 m)   Body mass index is 26.16 kg/m.  General Alert, O x 3, WD, NAD  Neck Supple, mid-line trachea,    Pulmonary Sym exp, good B air movt, CTA B  Cardiac RRR, Nl S1, S2, no Murmurs, No rubs, No S3,S4  Vascular Vessel Right Left  Radial Palpable Palpable  Brachial Palpable Palpable  Carotid Palpable, No Bruit Palpable, No Bruit  Aorta Not palpable N/A  Femoral Palpable Palpable  Popliteal Not palpable Not palpable  PT Palpable Palpable  DP Palpable Palpable    Gastro- intestinal soft, non-distended, non-tender to palpation, No guarding  or rebound, no HSM, no masses, no CVAT B, No palpable prominent aortic pulse,    Musculo- skeletal M/S 5/5 throughout  , Extremities without ischemic changes    Neurologic Cranial nerves 2-12 intact , Pain and light touch intact in extremities , Motor exam as listed above    Non-Invasive Vascular Imaging   B Carotid Duplex (02/13/18):   R ICA stenosis:  1-39%  R VA: high resistance  L ICA stenosis:  40-59%  L VA: patent and antegrade   Medical Decision Making   Marc [name] is a 77 y.o. 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 August Luz) for bilateral vertebral  artery angiography, possible left VA intervention.  Patient will follow up in 3 months to follow up on VA angiography.  I discussed in depth with the patient the nature of atherosclerosis, and emphasized the importance of maximal medical management including strict control of blood pressure, blood glucose, and lipid levels, antiplatelet agents, obtaining regular exercise, and cessation of smoking.    The patient is aware that without maximal medical management the underlying atherosclerotic disease process will progress, limiting the benefit of any interventions.  The patient is currently on a statin: Crestor.   The patient is currently on an anti-platelet: ASA.  Thank you for allowing [place] to participate in this patient's care.   Adele Barthel, MD, FACS [facility] Office: [phone] Pager: [phone]

## 2018-03-29 ENCOUNTER — Other Ambulatory Visit: Payer: Self-pay

## 2018-03-29 ENCOUNTER — Encounter: Payer: Self-pay | Admitting: Vascular Surgery

## 2018-03-29 ENCOUNTER — Other Ambulatory Visit: Payer: Medicare Other

## 2018-03-29 ENCOUNTER — Ambulatory Visit
Admission: RE | Admit: 2018-03-29 | Discharge: 2018-03-29 | Disposition: A | Discharge status: Home / Self Care | Payer: Medicare Other | Source: Ambulatory Visit | Attending: Vascular Surgery | Admitting: Vascular Surgery

## 2018-03-29 ENCOUNTER — Ambulatory Visit: Payer: Medicare Other | Admitting: Vascular Surgery

## 2018-03-29 DIAGNOSIS — I63219 Cerebral infarction due to unspecified occlusion or stenosis of unspecified vertebral arteries: Secondary | ICD-10-CM | POA: Diagnosis not present

## 2018-03-29 DIAGNOSIS — I6501 Occlusion and stenosis of right vertebral artery: Secondary | ICD-10-CM

## 2018-03-29 MED ORDER — IOPAMIDOL (ISOVUE-370) INJECTION 76%
75.0000 mL | Freq: Once | INTRAVENOUS | Status: AC | PRN
  Administered 2018-03-29: 75 mL via INTRAVENOUS

## 2018-03-30 ENCOUNTER — Telehealth (HOSPITAL_COMMUNITY): Payer: Self-pay

## 2018-03-30 ENCOUNTER — Other Ambulatory Visit: Payer: Self-pay

## 2018-03-30 ENCOUNTER — Other Ambulatory Visit: Payer: Self-pay | Admitting: Vascular Surgery

## 2018-03-30 DIAGNOSIS — I771 Stricture of artery: Secondary | ICD-10-CM

## 2018-03-30 NOTE — Telephone Encounter (Signed)
Daughter will talk to the pt and call back to pick a date for angiogram. AW

## 2018-04-07 ENCOUNTER — Ambulatory Visit (HOSPITAL_COMMUNITY): Admission: RE | Admit: 2018-04-07 | Payer: Medicare Other | Source: Ambulatory Visit

## 2018-04-12 ENCOUNTER — Other Ambulatory Visit: Payer: Self-pay | Admitting: Radiology

## 2018-04-14 ENCOUNTER — Ambulatory Visit (HOSPITAL_COMMUNITY): Payer: Medicare Other

## 2018-04-14 ENCOUNTER — Encounter (HOSPITAL_COMMUNITY): Payer: Self-pay

## 2018-04-14 ENCOUNTER — Other Ambulatory Visit: Payer: Self-pay | Admitting: Vascular Surgery

## 2018-04-14 ENCOUNTER — Other Ambulatory Visit: Payer: Self-pay

## 2018-04-14 ENCOUNTER — Ambulatory Visit (HOSPITAL_COMMUNITY)
Admission: RE | Admit: 2018-04-14 | Discharge: 2018-04-14 | Disposition: A | Discharge status: Home / Self Care | Payer: Medicare Other | Source: Ambulatory Visit | Attending: Vascular Surgery | Admitting: Vascular Surgery

## 2018-04-14 DIAGNOSIS — I340 Nonrheumatic mitral (valve) insufficiency: Secondary | ICD-10-CM | POA: Diagnosis not present

## 2018-04-14 DIAGNOSIS — Z794 Long term (current) use of insulin: Secondary | ICD-10-CM | POA: Diagnosis not present

## 2018-04-14 DIAGNOSIS — I4891 Unspecified atrial fibrillation: Secondary | ICD-10-CM | POA: Insufficient documentation

## 2018-04-14 DIAGNOSIS — E119 Type 2 diabetes mellitus without complications: Secondary | ICD-10-CM | POA: Insufficient documentation

## 2018-04-14 DIAGNOSIS — M1990 Unspecified osteoarthritis, unspecified site: Secondary | ICD-10-CM | POA: Insufficient documentation

## 2018-04-14 DIAGNOSIS — I771 Stricture of artery: Secondary | ICD-10-CM

## 2018-04-14 DIAGNOSIS — Z87891 Personal history of nicotine dependence: Secondary | ICD-10-CM | POA: Diagnosis not present

## 2018-04-14 DIAGNOSIS — Z7901 Long term (current) use of anticoagulants: Secondary | ICD-10-CM | POA: Insufficient documentation

## 2018-04-14 DIAGNOSIS — I701 Atherosclerosis of renal artery: Secondary | ICD-10-CM | POA: Insufficient documentation

## 2018-04-14 DIAGNOSIS — I2510 Atherosclerotic heart disease of native coronary artery without angina pectoris: Secondary | ICD-10-CM | POA: Insufficient documentation

## 2018-04-14 DIAGNOSIS — E785 Hyperlipidemia, unspecified: Secondary | ICD-10-CM | POA: Insufficient documentation

## 2018-04-14 DIAGNOSIS — I10 Essential (primary) hypertension: Secondary | ICD-10-CM | POA: Insufficient documentation

## 2018-04-14 DIAGNOSIS — I700 Atherosclerosis of aorta: Secondary | ICD-10-CM | POA: Diagnosis not present

## 2018-04-14 DIAGNOSIS — G450 Vertebro-basilar artery syndrome: Secondary | ICD-10-CM | POA: Insufficient documentation

## 2018-04-14 DIAGNOSIS — I252 Old myocardial infarction: Secondary | ICD-10-CM | POA: Diagnosis not present

## 2018-04-14 HISTORY — PX: IR ANGIO VERTEBRAL SEL SUBCLAVIAN INNOMINATE UNI R MOD SED: IMG5365

## 2018-04-14 HISTORY — PX: IR ANGIO VERTEBRAL SEL VERTEBRAL UNI L MOD SED: IMG5367

## 2018-04-14 HISTORY — PX: IR ANGIO INTRA EXTRACRAN SEL COM CAROTID INNOMINATE BILAT MOD SED: IMG5360

## 2018-04-14 LAB — CBC
HEMATOCRIT: 37.1 % — AB (ref 39.0–52.0)
HEMOGLOBIN: 11.2 g/dL — AB (ref 13.0–17.0)
MCH: 25.3 pg — AB (ref 26.0–34.0)
MCHC: 30.2 g/dL (ref 30.0–36.0)
MCV: 83.9 fL (ref 78.0–100.0)
Platelets: 161 10*3/uL (ref 150–400)
RBC: 4.42 MIL/uL (ref 4.22–5.81)
RDW: 14.8 % (ref 11.5–15.5)
WBC: 6.1 10*3/uL (ref 4.0–10.5)

## 2018-04-14 LAB — PROTIME-INR
INR: 1.19
Prothrombin Time: 15 seconds (ref 11.4–15.2)

## 2018-04-14 LAB — BASIC METABOLIC PANEL
ANION GAP: 9 (ref 5–15)
BUN: 26 mg/dL — ABNORMAL HIGH (ref 8–23)
CHLORIDE: 113 mmol/L — AB (ref 98–111)
CO2: 22 mmol/L (ref 22–32)
Calcium: 10.1 mg/dL (ref 8.9–10.3)
Creatinine, Ser: 1.31 mg/dL — ABNORMAL HIGH (ref 0.61–1.24)
GFR calc non Af Amer: 51 mL/min — ABNORMAL LOW (ref >60)
GFR, EST AFRICAN AMERICAN: 59 mL/min — AB (ref >60)
GLUCOSE: 146 mg/dL — AB (ref 70–99)
Potassium: 4.6 mmol/L (ref 3.5–5.1)
Sodium: 144 mmol/L (ref 135–145)

## 2018-04-14 LAB — GLUCOSE, CAPILLARY: Glucose-Capillary: 147 mg/dL — ABNORMAL HIGH (ref 70–99)

## 2018-04-14 MED ORDER — LIDOCAINE HCL 1 % IJ SOLN
INTRAMUSCULAR | Status: AC | PRN
  Administered 2018-04-14: 10 mL

## 2018-04-14 MED ORDER — FENTANYL CITRATE (PF) 100 MCG/2ML IJ SOLN
INTRAMUSCULAR | Status: AC | PRN
  Administered 2018-04-14: 25 ug via INTRAVENOUS

## 2018-04-14 MED ORDER — LIDOCAINE HCL 1 % IJ SOLN
INTRAMUSCULAR | Status: AC
  Filled 2018-04-14: qty 20

## 2018-04-14 MED ORDER — FENTANYL CITRATE (PF) 100 MCG/2ML IJ SOLN
INTRAMUSCULAR | Status: AC
  Filled 2018-04-14: qty 2

## 2018-04-14 MED ORDER — HEPARIN SODIUM (PORCINE) 1000 UNIT/ML IJ SOLN
INTRAMUSCULAR | Status: AC | PRN
  Administered 2018-04-14: 1000 [IU] via INTRAVENOUS

## 2018-04-14 MED ORDER — SODIUM CHLORIDE 0.9 % IV SOLN
Freq: Once | INTRAVENOUS | Status: AC
  Administered 2018-04-14: 09:00:00 via INTRAVENOUS

## 2018-04-14 MED ORDER — IOPAMIDOL (ISOVUE-300) INJECTION 61%
INTRAVENOUS | Status: AC
  Filled 2018-04-14: qty 50

## 2018-04-14 MED ORDER — MIDAZOLAM HCL 2 MG/2ML IJ SOLN
INTRAMUSCULAR | Status: AC | PRN
  Administered 2018-04-14: 1 mg via INTRAVENOUS

## 2018-04-14 MED ORDER — MIDAZOLAM HCL 2 MG/2ML IJ SOLN
INTRAMUSCULAR | Status: AC
  Filled 2018-04-14: qty 2

## 2018-04-14 MED ORDER — SODIUM CHLORIDE 0.9 % IV SOLN: INTRAVENOUS | Status: AC

## 2018-04-14 MED ORDER — IOHEXOL 300 MG/ML  SOLN
150.0000 mL | Freq: Once | INTRAMUSCULAR | Status: AC | PRN
  Administered 2018-04-14: 90 mL via INTRA_ARTERIAL

## 2018-04-14 MED ORDER — HEPARIN SODIUM (PORCINE) 1000 UNIT/ML IJ SOLN
INTRAMUSCULAR | Status: AC
  Filled 2018-04-14: qty 1

## 2018-04-14 NOTE — Sedation Documentation (Signed)
Patient is resting comfortably. 

## 2018-04-14 NOTE — H&P (Signed)
Chief Complaint: Patient was seen in consultation today for cerebral arteriogram at the request of [place] [facility]  Referring Physician(s): [place]  Supervising Physician: Luanne Bras  Patient Status: [facility] - Out-pt  History of Present Illness: Marc [name] is a 77 y.o. male   Previous abnormal MRA- outside facility Showing possible occluded Rt VA and high grade stenosis of dominant Lt VA Dizziness Denies N/V; numbness;tingling Denies headache; facial droop  CTA 03/29/18: IMPRESSION: 1. Non dominant right vertebral artery is diminutive and intermittently occluded in the neck but reconstituted distally. It remains patent in the right V3 and V4 segments despite tandem moderate and severe stenoses. 2. Estimated 75% stenosis at the origin of the dominant left vertebral artery. No other right vertebral stenosis. 3. No significant cervical carotid artery stenosis despite multifocal soft and calcified plaque. 4. Aortic Atherosclerosis (ICD10-I70.0) with bulky soft plaque along the undersurface of the arch.  Scheduled now for cerebral arteriogram per Dr Glennon Hamilton request Afib: LD Coumadin 8/3  Past Medical History:  Diagnosis Date  . AAA (abdominal aortic aneurysm) (HCC)    small. An abdominal ultrasound in 2011 showed no aneurysm  . ACS (acute coronary syndrome) ([place])   . Arthritis   . Atrial fibrillation ([place])    permanent  . Coronary artery disease   . Diabetes mellitus without complication ([place])   . Hyperlipidemia   . Hypertension   . Mitral regurgitation    with MVP. Mild to moderate  . Myocardial infarction ([place])   . Renal artery stenosis ([place])   . 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 Treated medically.   . Sexual dysfunction     Past Surgical History:  Procedure Laterality Date  . CARDIAC CATHETERIZATION  2006   showed an occluded Left circumflex with collaterals as well as 60% mid right  coronary artery stenosis  . CARDIAC CATHETERIZATION  02/2011   LM: 20%, LAD: calcified 20%, LCX: occluded distal AV groove supplying a small area, RCA: calcified with 50-60% proximal-mid disease  . CIRCUMCISION    . FOOT SURGERY Right   . LEFT HEART CATH AND CORONARY ANGIOGRAPHY N/A 01/04/2018   Procedure: LEFT HEART CATH AND CORONARY ANGIOGRAPHY;  Surgeon: Wellington Hampshire, MD;  Location: [facility];  Service: Cardiovascular;  Laterality: N/A;    Allergies: Lisinopril; Ticagrelor; Cephalexin; Prednisone; and Simvastatin  Medications: Prior to Admission medications   Medication Sig Start Date End Date Taking? Authorizing Provider  carvedilol (COREG) 25 MG tablet Take 1 tablet (25 mg total) by mouth 2 (two) times daily with a meal. 01/06/18  Yes Rosita Fire, Brittainy M, PA-C  Coenzyme Q10 (COQ10) 100 MG CAPS Take 100 mg by mouth daily.   Yes [provider]  diltiazem (CARDIZEM CD) 300 MG 24 hr capsule Take 300 mg by mouth daily.    Yes [provider]  glipiZIDE (GLUCOTROL XL) 5 MG 24 hr tablet TAKE 1 TABLET BY MOUTH DAILY 07/18/17  Yes [provider]  Insulin Glargine (LANTUS SOLOSTAR) 100 UNIT/ML Solostar Pen Inject 10 Units into the skin at bedtime.  01/10/17  Yes [provider]  isosorbide mononitrate (IMDUR) 30 MG 24 hr tablet Take 0.5 tablets (15 mg total) by mouth daily. Patient taking differently: Take 15 mg by mouth at bedtime.  03/23/18  Yes Revankar, Reita Cliche, MD  nitroGLYCERIN (NITROSTAT) 0.4 MG SL tablet Place 1 tablet (0.4 mg total) under the tongue every 5 (  five) minutes as needed for chest pain. 01/06/18  Yes Simmons, Brittainy M, PA-C  sertraline (ZOLOFT) 25 MG tablet Take 25 mg by mouth daily.  01/09/18  Yes [provider]  tamsulosin (FLOMAX) 0.4 MG CAPS capsule Take 0.4 mg by mouth at bedtime.  10/13/17  Yes [provider]  traMADol-acetaminophen (ULTRACET) 37.5-325 MG per tablet Take 1 tablet by mouth every 6 (six) hours  as needed for moderate pain.    Yes [provider]  Insulin Pen Needle (NOVOFINE) 32G X 6 MM MISC USE AS DIRECTED. 10/20/16   [provider]  rosuvastatin (CRESTOR) 20 MG tablet Take 1 tablet (20 mg total) by mouth daily at 6 PM. Patient not taking: Reported on 04/13/2018 01/06/18   Lyda Jester M, PA-C  warfarin (COUMADIN) 5 MG tablet Take 5 mg by mouth daily.  02/20/18   [provider]     Family History  Problem Relation Age of Onset  . Heart disease Sister     Social History   Socioeconomic History  . Marital status: Widowed    Spouse name: Not on file  . Number of children: Not on file  . Years of education: Not on file  . Highest education level: Not on file  Occupational History  . 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 Financial resource strain: Not on file  . Food insecurity:    Worry: Not on file    Inability: Not on file  . Transportation needs:    Medical: Not on file    Non-medical: Not on file  Tobacco Use  . Smoking status: Former Smoker    Packs/day: 0.10    Years: 30.00    Pack years: 3.00    Types: Cigarettes    Last attempt to quit: 12/15/1983    Years since quitting: 34.3  . Smokeless tobacco: Former Systems developer    Types: [place] date: 09/07/1983  Substance and Sexual Activity  . Alcohol use: No  . Drug use: No  . Sexual activity: Not on file  Lifestyle  . Physical activity:    Days per week: Not on file    Minutes per session: Not on file  . Stress: Not on file  Relationships  . Social connections:    Talks on phone: Not on file    Gets together: Not on file    Attends religious service: Not on file    Active member of club or organization: Not on file    Attends meetings of clubs or organizations: Not on file    Relationship status: Not on file  Other Topics Concern  . Not on file  Social History Narrative  . Not on file    Review of Systems: A 12 point ROS discussed and  pertinent positives are indicated in the HPI above.  All other systems are negative.  Review of Systems  Constitutional: Negative for activity change, fatigue and fever.  HENT: Positive for hearing loss. Negative for tinnitus.   Respiratory: Negative for cough and shortness of breath.   Cardiovascular: Negative for chest pain.  Gastrointestinal: Negative for abdominal pain.  Musculoskeletal: Negative for back pain.  Neurological: Positive for dizziness. Negative for tremors, seizures, syncope, facial asymmetry, speech difficulty, weakness, light-headedness, numbness and headaches.  Psychiatric/Behavioral: Negative for behavioral problems and confusion.    Vital Signs: BP (!) 143/71   Pulse (!) 41   Temp 97.8 F (36.6 C)   Ht  6' (1.829 m)   Wt 190 lb (86.2 kg)   SpO2 99%   BMI 25.77 kg/m   Physical Exam  Constitutional: He is oriented to person, place, and time. He appears well-nourished.  HENT:  Head: Atraumatic.  Eyes: EOM are normal.  Neck: Neck supple.  Cardiovascular: Regular rhythm.  Irreg Rate  Pulmonary/Chest: Effort normal and breath sounds normal.  Abdominal: Soft. Bowel sounds are normal.  Musculoskeletal: Normal range of motion.  Neurological: He is alert and oriented to person, place, and time.  Skin: Skin is warm and dry.  Psychiatric: He has a normal mood and affect. His behavior is normal. Judgment and thought content normal.  Nursing note and vitals reviewed.   Imaging: Ct Angio Neck W Or Wo Contrast  Result Date: 03/29/2018 CLINICAL DATA:  77 year old male with Neck MRA earlier this year raising the possibility of occluded right vertebral artery and high-grade origin stenosis at the origin of the dominant left vertebral artery. EXAM: CT ANGIOGRAPHY NECK TECHNIQUE: Multidetector CT imaging of the neck was performed using the standard protocol during bolus administration of intravenous contrast. Multiplanar CT image reconstructions and MIPs were obtained to  evaluate the vascular anatomy. Carotid stenosis measurements (when applicable) are obtained utilizing NASCET criteria, using the distal internal carotid diameter as the denominator. CONTRAST:  14mL ISOVUE-370 IOPAMIDOL (ISOVUE-370) INJECTION 76% COMPARISON:  Brain MRI head and neck MRA 01/20/2018. chest CTA 12/25/2012. FINDINGS: Skeleton: Absent dentition. No acute osseous abnormality identified. Visualized paranasal sinuses and mastoids are stable and well pneumatized. Cervical spine degeneration with intermittent moderate to severe facet hypertrophy and disc space loss. Upper chest: Centrilobular upper lobe emphysema. No superior mediastinal lymphadenopathy. Other neck: Negative.  No neck mass or lymphadenopathy. 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 Aortic arch: 3 vessel arch configuration. Moderately bulky soft plaque along the undersurface of the arch (series 5, image 5). Soft and calcified plaque at the great vessel origins. Right carotid system: No brachiocephalic artery or right CCA origin despite plaque. Mild soft plaque in the right CCA proximal to the bifurcation. Soft and calcified plaque at the right ICA origin with less than 50% stenosis with respect to the distal vessel. Mildly tortuous cervical right ICA without additional stenosis. The right ICA siphon is patent with moderate calcified plaque. No right ICA siphon stenosis was evident on the May intracranial MRA. Fetal type right PCA origin redemonstrated. Left carotid system: No left CCA origin stenosis despite soft and calcified plaque. Soft and calcified plaque begins in there were nest in the left CCA at the level of the thyroid and is located anteriorly and medially and continues to the bifurcation. Subsequent stenosis approaches 50% with respect to the distal vessel at the hypopharynx (series 5, image 78). Soft and calcified plaque at the left ICA origin and bulb results in less than 50%  stenosis with respect to the distal vessel. Patent cervical left ICA to the skull base. Mild to moderate left siphon calcified plaque is evident. No left siphon stenosis demonstrated on the recent MRA. Vertebral arteries: No proximal right subclavian artery stenosis despite calcified plaque. The right vertebral artery is non dominant and diminutive. There is severe stenosis, near occlusion at the right vertebral artery origin but faint enhancement of the vessel is noted intermittently throughout the neck to the C3-C4 level. The distal V2 segment then appears occluded but enhancement is reconstituted in the distal V3 and V4 segments where multifocal moderate to severe stenosis occurs none  the less (series 7 images 116-123. The right vertebrobasilar junction is patent. The right AICA appears dominant. The basilar artery is patent with fetal type right PCA origin. No proximal left subclavian artery stenosis despite soft and calcified plaque. There is bulky calcified plaque at the origin of the dominant left vertebral artery with moderate to severe left vertebral origin stenosis numerically estimated at 75 % with respect to the distal vessel (series 7, image 80). Tortuous left V1 segment. The left vertebral artery is otherwise patent to the basilar without stenosis. There is mild left V4 segment calcified plaque. The left PICA origin is normal. Review of the MIP images confirms the above findings IMPRESSION: 1. Non dominant right vertebral artery is diminutive and intermittently occluded in the neck but reconstituted distally. It remains patent in the right V3 and V4 segments despite tandem moderate and severe stenoses. 2. Estimated 75% stenosis at the origin of the dominant left vertebral artery. No other right vertebral stenosis. 3. No significant cervical carotid artery stenosis despite multifocal soft and calcified plaque. 4. Aortic Atherosclerosis (ICD10-I70.0) with bulky soft plaque along the undersurface of the  arch. 5. Emphysema (ICD10-J43.9). Electronically Signed   By: Genevie Ann M.D.   On: 03/29/2018 13:50    Labs:  CBC: Recent Labs    01/04/18 0059 01/05/18 0211 01/06/18 0232 04/14/18 0910  WBC 6.7 6.7 5.8 6.1  HGB 13.3 12.4* 11.5* 11.2*  HCT 38.9* 36.9* 34.6* 37.1*  PLT 168 156 137* 161    COAGS: Recent Labs    01/04/18 0059 01/04/18 0628 01/05/18 0828  APTT 88* 89* 89*    BMP: Recent Labs    01/03/18 2255  NA 142  K 3.7  CL 107  CO2 27  GLUCOSE 139*  BUN 13  CALCIUM 9.7  CREATININE 1.12  GFRNONAA >60  GFRAA >60    LIVER FUNCTION TESTS: No results for input(s): BILITOT, AST, ALT, ALKPHOS, PROT, ALBUMIN in the last 8760 hours.  TUMOR MARKERS: No results for input(s): AFPTM, CEA, CA199, CHROMGRNA in the last 8760 hours.  Assessment and Plan:  Dizziness Previous MRA reveals L VA stenosis CTA confirms findings Now for cerebral arteriogram Risks and benefits of cerebral angiogram with intervention were discussed with the patient including, but not limited to bleeding, infection, vascular injury, contrast induced renal failure, stroke or even death.  This interventional procedure involves the use of X-rays and because of the nature of the planned procedure, it is possible that we will have prolonged use of X-ray fluoroscopy.  Potential radiation risks to you include (but are not limited to) the following: - A slightly elevated risk for cancer  several years later in life. This risk is typically less than 0.5% percent. This risk is low in comparison to the normal incidence of human cancer, which is 33% for women and 50% for men according to the [place]. - Radiation induced injury can include skin redness, resembling a rash, tissue breakdown / ulcers and hair loss (which can be temporary or permanent).   The likelihood of either of these occurring depends on the difficulty of the procedure and whether you are sensitive to radiation due to previous  procedures, disease, or genetic conditions.   IF your procedure requires a prolonged use of radiation, you will be notified and given written instructions for further action.  It is your responsibility to monitor the irradiated area for the 2 weeks following the procedure and to notify your physician if you are concerned that  you have suffered a radiation induced injury.    All of the patient's questions were answered, patient is agreeable to proceed.  Consent signed and in chart.  Thank you for this interesting consult.  I greatly enjoyed meeting Marc [name] and look forward to participating in their care.  A copy of this report was sent to the requesting provider on this date.  Electronically Signed: Lavonia Drafts, PA-C 04/14/2018, 9:39 AM   I spent a total of  30 Minutes   in face to face in clinical consultation, greater than 50% of which was counseling/coordinating care for cerebral arteriogram

## 2018-04-14 NOTE — Discharge Instructions (Signed)

## 2018-04-14 NOTE — Progress Notes (Signed)
Pt transferred to SStay, groin WNL  Checked with RN, pulses palpable, unchanged. Report given. Instructions give to pt about bedrest and post-sheath removal. IR team signing off.

## 2018-04-14 NOTE — Sedation Documentation (Signed)
No closure device used, IR holding pressure R groin

## 2018-04-14 NOTE — Sedation Documentation (Signed)
Pressure hold complete 1130- bedrest 4 hrs

## 2018-04-14 NOTE — Sedation Documentation (Signed)
02 2L on for procedure

## 2018-04-14 NOTE — Procedures (Signed)
S/P 4 vessel cerebral arteriogram RT CFA approach. Findings. 1.Occluded RT VA  At origin. 2. Approx 60 % stenosis of origin dominant Lt VA. 3.Approx 55 % stenosis of Lt ICA prox . 4. 60 to 70 Stenosis of Lt ECA prox.

## 2018-04-18 ENCOUNTER — Encounter (HOSPITAL_COMMUNITY): Payer: Self-pay | Admitting: Interventional Radiology

## 2018-06-14 ENCOUNTER — Other Ambulatory Visit: Payer: Self-pay | Admitting: Cardiology

## 2018-06-27 ENCOUNTER — Encounter: Payer: Self-pay | Admitting: Vascular Surgery

## 2018-06-27 ENCOUNTER — Ambulatory Visit: Payer: Medicare Other | Admitting: Vascular Surgery

## 2018-06-27 VITALS — BP 162/60 | HR 65 | Temp 97.2°F | Resp 16 | Ht 72.0 in | Wt 191.6 lb

## 2018-06-27 DIAGNOSIS — I6501 Occlusion and stenosis of right vertebral artery: Secondary | ICD-10-CM

## 2018-06-27 NOTE — Progress Notes (Signed)
Patient name: Marc [name] MRN: [id number] DOB: 19-Mar-1941 Sex: male  REASON FOR VISIT: 1-month follow-up for dizziness  HPI: Marc [name] is a 77 y.o. male with multiple medical comorbidities as listed below that presents for interval 63-month follow-up after previously being evaluated by Dr. Bridgett Larsson for dizziness.  Ultimately patient was found to have vertebral stenosis and Dr. Bridgett Larsson referred the patient to IR for possible intervention.  Dr. Estanislado Pandy ultimately did an angiogram and found that he had an occluded right vert, 60% stenosis of his dominant left vert, and 55% stenosis of his left internal carotid artery.  On follow-up today patient states he is having no issues at this time.  He denies any further episodes of dizziness.  In addition as it relates to his carotid disease he denies any history of TIA or stroke.  He will not take an aspirin given that he is on warfarin and is refusing to take both.  He also states he is not taking a statin since it makes him feel bad.  The plan with IR was follow-up CTA in 3-4 months and medical management per note.   Past Medical History:  Diagnosis Date  . AAA (abdominal aortic aneurysm) (HCC)    small. An abdominal ultrasound in 2011 showed no aneurysm  . ACS (acute coronary syndrome) ([place])   . Arthritis   . Atrial fibrillation ([place])    permanent  . Coronary artery disease   . Diabetes mellitus without complication ([place])   . Hyperlipidemia   . Hypertension   . Mitral regurgitation    with MVP. Mild to moderate  . Myocardial infarction ([place])   . Renal artery stenosis ([place])   . Renal artery stenosis in 1 of 2 vessels ([place]) 2006   subtotal occlusion of right renal artery. Treated medically.   . Sexual dysfunction     Past Surgical History:  Procedure Laterality Date  . CARDIAC CATHETERIZATION  2006   showed an occluded Left circumflex with collaterals as well as 60% mid right coronary artery stenosis  . CARDIAC CATHETERIZATION  02/2011   LM: 20%, LAD: calcified 20%, LCX: occluded distal AV groove supplying a small area, RCA: calcified with 50-60% proximal-mid disease  . CIRCUMCISION    . FOOT SURGERY Right   . IR ANGIO INTRA EXTRACRAN SEL COM CAROTID INNOMINATE BILAT MOD SED  04/14/2018  . IR ANGIO VERTEBRAL SEL SUBCLAVIAN INNOMINATE UNI R MOD SED  04/14/2018  . IR ANGIO VERTEBRAL SEL VERTEBRAL UNI L MOD SED  04/14/2018  . LEFT HEART CATH AND CORONARY ANGIOGRAPHY N/A 01/04/2018   Procedure: LEFT HEART CATH AND CORONARY ANGIOGRAPHY;  Surgeon: Wellington Hampshire, MD;  Location: [facility];  Service: Cardiovascular;  Laterality: N/A;    Family History  Problem Relation Age of Onset  . Heart disease Sister     SOCIAL HISTORY: Social History   Tobacco Use  . Smoking status: Former Smoker    Packs/day: 0.10    Years: 30.00    Pack years: 3.00    Types: Cigarettes    Last attempt to quit: 12/15/1983    Years since quitting: 34.5  . Smokeless tobacco: Former Systems developer    Types: Chew    Quit date: 09/07/1983  Substance Use Topics  . Alcohol use: No    Allergies  Allergen Reactions  . Lisinopril Shortness Of Breath       . Ticagrelor Shortness Of Breath and Other (See Comments)    * Brilinta* Pt  short of breath  . Cephalexin Diarrhea and Nausea And Vomiting       . Prednisone Other (See Comments)    GI Issues  . Simvastatin Other (See Comments)    Unsure    Current Outpatient Medications  Medication Sig Dispense Refill  . carvedilol (COREG) 25 MG tablet TAKE 1 TABLET BY MOUTH TWICE DAILY WITH A MEAL 60 tablet 3  . Coenzyme Q10 (COQ10) 100 MG CAPS Take 100 mg by mouth daily.    Marland Kitchen diltiazem (CARDIZEM CD) 300 MG 24 hr capsule Take 300 mg by mouth daily.     Marland Kitchen glipiZIDE (GLUCOTROL XL) 5 MG 24 hr tablet TAKE 1 TABLET BY MOUTH DAILY    . Insulin Glargine (LANTUS SOLOSTAR) 100 UNIT/ML Solostar Pen Inject 10 Units into the skin at bedtime.     . Insulin Pen Needle (NOVOFINE) 32G X 6 MM MISC USE AS DIRECTED.    Marland Kitchen  isosorbide mononitrate (IMDUR) 30 MG 24 hr tablet Take 0.5 tablets (15 mg total) by mouth daily. (Patient taking differently: Take 15 mg by mouth at bedtime. ) 30 tablet 0  . nitroGLYCERIN (NITROSTAT) 0.4 MG SL tablet Place 1 tablet (0.4 mg total) under the tongue every 5 (five) minutes as needed for chest pain. 25 tablet 2  . rosuvastatin (CRESTOR) 20 MG tablet Take 1 tablet (20 mg total) by mouth daily at 6 PM. 30 tablet 5  . sertraline (ZOLOFT) 25 MG tablet Take 25 mg by mouth daily.     . tamsulosin (FLOMAX) 0.4 MG CAPS capsule Take 0.4 mg by mouth at bedtime.     . traMADol-acetaminophen (ULTRACET) 37.5-325 MG per tablet Take 1 tablet by mouth every 6 (six) hours as needed for moderate pain.     Marland Kitchen warfarin (COUMADIN) 5 MG tablet Take 5 mg by mouth daily.      No current facility-administered medications for this visit.     REVIEW OF SYSTEMS:  [X]  denotes positive finding, [ ]  denotes negative finding Cardiac  Comments:  Chest pain or chest pressure:    Shortness of breath upon exertion:    Short of breath when lying flat:    Irregular heart rhythm:        Vascular    Pain in calf, thigh, or hip brought on by ambulation:    Pain in feet at night that wakes you up from your sleep:     Blood clot in your veins:    Leg swelling:         Pulmonary    Oxygen at home:    Productive cough:     Wheezing:         Neurologic    Sudden weakness in arms or legs:     Sudden numbness in arms or legs:     Sudden onset of difficulty speaking or slurred speech:    Temporary loss of vision in one eye:     Problems with dizziness:         Gastrointestinal    Blood in stool:     Vomited blood:         Genitourinary    Burning when urinating:     Blood in urine:        Psychiatric    Major depression:         Hematologic    Bleeding problems:    Problems with blood clotting too easily:        Skin    Rashes or  ulcers:        Constitutional    Fever or chills:      PHYSICAL  EXAM: Vitals:   06/27/18 1103 06/27/18 1105  BP: (!) 172/82 (!) 182/87  Pulse: 85 65  Resp: 16   Temp: (!) 97.2 F (36.2 C)   TempSrc: Oral   SpO2: 98% 100%  Weight: 86.9 kg   Height: 6' (1.829 m)     GENERAL: The patient is a well-nourished male, in no acute distress. The vital signs are documented above. CARDIAC: There is a regular rate and rhythm.  VASCULAR:  No carotid bruit 2+ radial pulse palpable bilateral upper extremities PULMONARY: There is good air exchange bilaterally without wheezing or rales. ABDOMEN: Soft and non-tender with normal pitched bowel sounds.  MUSCULOSKELETAL: There are no major deformities or cyanosis. NEUROLOGIC: No focal weakness or paresthesias are detected.  CN II-XII grossly intact.  SKIN: There are no ulcers or rashes noted. PSYCHIATRIC: The patient has a normal affect.  DATA:   None  Assessment/Plan:  Patient was referred to Dr. Estanislado Pandy with interventional radiology by Dr. 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  As noted by Dr. Estanislado Pandy patient had a right vertebral occlusion and a moderate 60% stenosis of his left dominant vertebral artery.  Per the documents it appears IR planned a 3 to 22-month follow-up CTA with recommendation for medical management.  Patient is having no symptoms on evaluation today.  I think it would be appropriate that we see him back in 1 year for ongoing surveillance of his left internal carotid disease where he currently has an approximate 55% stenosis.  Discussed medical management with the patient but he is adamantly refusing an aspirin and statin at this time.  Look forward to seeing him in one year.   Marty Heck, MD [facility] Office: [phone] Pager: [place]

## 2018-09-08 DIAGNOSIS — I10 Essential (primary) hypertension: Secondary | ICD-10-CM | POA: Diagnosis not present

## 2018-09-08 DIAGNOSIS — I340 Nonrheumatic mitral (valve) insufficiency: Secondary | ICD-10-CM | POA: Diagnosis not present

## 2018-09-08 DIAGNOSIS — I361 Nonrheumatic tricuspid (valve) insufficiency: Secondary | ICD-10-CM

## 2018-09-08 DIAGNOSIS — E785 Hyperlipidemia, unspecified: Secondary | ICD-10-CM

## 2018-09-08 DIAGNOSIS — R0602 Shortness of breath: Secondary | ICD-10-CM | POA: Diagnosis not present

## 2018-09-08 DIAGNOSIS — I509 Heart failure, unspecified: Secondary | ICD-10-CM | POA: Diagnosis not present

## 2018-09-08 DIAGNOSIS — E1159 Type 2 diabetes mellitus with other circulatory complications: Secondary | ICD-10-CM

## 2018-09-08 DIAGNOSIS — K219 Gastro-esophageal reflux disease without esophagitis: Secondary | ICD-10-CM

## 2018-09-09 DIAGNOSIS — I10 Essential (primary) hypertension: Secondary | ICD-10-CM | POA: Diagnosis not present

## 2018-09-09 DIAGNOSIS — K219 Gastro-esophageal reflux disease without esophagitis: Secondary | ICD-10-CM | POA: Diagnosis not present

## 2018-09-09 DIAGNOSIS — E1159 Type 2 diabetes mellitus with other circulatory complications: Secondary | ICD-10-CM | POA: Diagnosis not present

## 2018-09-09 DIAGNOSIS — I509 Heart failure, unspecified: Secondary | ICD-10-CM | POA: Diagnosis not present

## 2018-09-14 ENCOUNTER — Encounter: Payer: Self-pay | Admitting: Cardiology

## 2018-09-14 ENCOUNTER — Ambulatory Visit: Payer: Medicare Other | Admitting: Cardiology

## 2018-09-14 VITALS — BP 150/62 | HR 68 | Ht 72.0 in | Wt 189.0 lb

## 2018-09-14 DIAGNOSIS — Z794 Long term (current) use of insulin: Secondary | ICD-10-CM

## 2018-09-14 DIAGNOSIS — I10 Essential (primary) hypertension: Secondary | ICD-10-CM

## 2018-09-14 DIAGNOSIS — E782 Mixed hyperlipidemia: Secondary | ICD-10-CM

## 2018-09-14 DIAGNOSIS — E119 Type 2 diabetes mellitus without complications: Secondary | ICD-10-CM

## 2018-09-14 DIAGNOSIS — I2510 Atherosclerotic heart disease of native coronary artery without angina pectoris: Secondary | ICD-10-CM | POA: Diagnosis not present

## 2018-09-14 DIAGNOSIS — I4819 Other persistent atrial fibrillation: Secondary | ICD-10-CM

## 2018-09-14 DIAGNOSIS — I340 Nonrheumatic mitral (valve) insufficiency: Secondary | ICD-10-CM | POA: Diagnosis not present

## 2018-09-14 DIAGNOSIS — Z7901 Long term (current) use of anticoagulants: Secondary | ICD-10-CM

## 2018-09-14 NOTE — Patient Instructions (Signed)
Medication Instructions:  Your physician recommends that you continue on your current medications as directed. Please refer to the Current Medication list given to you today.  If you need a refill on your cardiac medications before your next appointment, please call your pharmacy.   Lab work: None  If you have labs (blood work) drawn today and your tests are completely normal, you will receive your results only by: Marland Kitchen MyChart Message (if you have MyChart) OR . A paper copy in the mail If you have any lab test that is abnormal or we need to change your treatment, we will call you to review the results.  Testing/Procedures: None  Follow-Up: At [facility], you and your health needs are our priority.  As part of our continuing mission to provide you with exceptional heart care, we have created designated Provider Care Teams.  These Care Teams include your primary Cardiologist (physician) and Advanced Practice Providers (APPs -  Physician Assistants and Nurse Practitioners) who all work together to provide you with the care you need, when you need it. You will need a follow up appointment in 3 months.  Please call our office 2 months in advance to schedule this appointment.  You may see Jenean Lindau, MD or another member of our [facility] Provider Team in [place]: Jenne Campus, MD . Shirlee More, MD  Any Other Special Instructions Will Be Listed Below (If Applicable).

## 2018-09-14 NOTE — Progress Notes (Signed)
Cardiology Office Note:    Date:  09/14/2018   ID:  Marc [name], DOB 1940/10/27, MRN [id number]  PCP:  Raina Mina., MD  Cardiologist:  Jenean Lindau, MD   Referring MD: Raina Mina., MD    ASSESSMENT:    1. Persistent atrial fibrillation   2. Coronary artery disease involving native coronary artery of native heart without angina pectoris   3. Essential hypertension   4. Mitral valve insufficiency, unspecified etiology   5. Mixed hyperlipidemia   6. Long term current use of anticoagulant therapy   7. Type 2 diabetes mellitus without complication, with long-term current use of insulin (HCC)    PLAN:    In order of problems listed above:  1. Secondary prevention stressed with the patient.  Importance of compliance with diet and medication stressed and he vocalized understanding. 2. I discussed with the patient atrial fibrillation, disease process. Management and therapy including rate and rhythm control, anticoagulation benefits and potential risks were discussed extensively with the patient. Patient had multiple questions which were answered to patient's satisfaction. 3. I told him to get blood work which is basically a Chem-7 by his primary care physician when he gets pro time checked.  This is because he is taking diuretic therapy. 4. Patient will be seen in follow-up appointment in 3 months or earlier if the patient has any concerns    Medication Adjustments/Labs and Tests Ordered: Current medicines are reviewed at length with the patient today.  Concerns regarding medicines are outlined above.  No orders of the defined types were placed in this encounter.  No orders of the defined types were placed in this encounter.    Chief Complaint  Patient presents with  . Hospitalization Follow-up     History of Present Illness:    Marc [name] is a 78 y.o. male.  Patient has known coronary artery disease, mild depression and left ventricular systolic function and  atrial fibrillation.  Was admitted to [facility] with congestive heart failure.  Subsequently he has done well.  Post discharge she is doing good and denies any chest pain orthopnea or PND and taking care of activities of daily living.  At the time of my evaluation, the patient is alert awake oriented and in no distress.  Past Medical History:  Diagnosis Date  . AAA (abdominal aortic aneurysm) (HCC)    small. An abdominal ultrasound in 2011 showed no aneurysm  . ACS (acute coronary syndrome) ([place])   . Arthritis   . Atrial fibrillation ([place])    permanent  . Coronary artery disease   . Diabetes mellitus without complication ([place])   . Hyperlipidemia   . Hypertension   . Mitral regurgitation    with MVP. Mild to moderate  . Myocardial infarction ([place])   . Renal artery stenosis ([employer])   . Renal artery stenosis in 1 of 2 vessels ([place]) 2006   subtotal occlusion of right renal artery. Treated medically.   . Sexual dysfunction     Past Surgical History:  Procedure Laterality Date  . CARDIAC CATHETERIZATION  2006   showed an occluded Left circumflex with collaterals as well as 60% mid right coronary artery stenosis  . CARDIAC CATHETERIZATION  02/2011   LM: 20%, LAD: calcified 20%, LCX: occluded distal AV groove supplying a small area, RCA: calcified with 50-60% proximal-mid disease  . CIRCUMCISION    . FOOT SURGERY Right   . IR ANGIO INTRA EXTRACRAN SEL COM CAROTID INNOMINATE BILAT MOD  SED  04/14/2018  . IR ANGIO VERTEBRAL SEL SUBCLAVIAN INNOMINATE UNI R MOD SED  04/14/2018  . IR ANGIO VERTEBRAL SEL VERTEBRAL UNI L MOD SED  04/14/2018  . LEFT HEART CATH AND CORONARY ANGIOGRAPHY N/A 01/04/2018   Procedure: LEFT HEART CATH AND CORONARY ANGIOGRAPHY;  Surgeon: Wellington Hampshire, MD;  Location: [place] [facility];  Service: Cardiovascular;  Laterality: N/A;    Current Medications: Current Meds  Medication Sig  . carvedilol (COREG) 25 MG tablet TAKE 1 TABLET BY MOUTH TWICE DAILY WITH A MEAL    . Coenzyme Q10 (COQ10) 100 MG CAPS Take 100 mg by mouth daily.  Marland Kitchen diltiazem (CARDIZEM CD) 300 MG 24 hr capsule Take 300 mg by mouth daily.   Marland Kitchen glipiZIDE (GLUCOTROL XL) 5 MG 24 hr tablet TAKE 1 TABLET BY MOUTH DAILY  . Insulin Glargine (LANTUS SOLOSTAR) 100 UNIT/ML Solostar Pen Inject 10 Units into the skin at bedtime.   . Insulin Pen Needle (NOVOFINE) 32G X 6 MM MISC USE AS DIRECTED.  Marland Kitchen isosorbide mononitrate (IMDUR) 30 MG 24 hr tablet Take 0.5 tablets (15 mg total) by mouth daily. (Patient taking differently: Take 15 mg by mouth at bedtime. )  . nitroGLYCERIN (NITROSTAT) 0.4 MG SL tablet Place 1 tablet (0.4 mg total) under the tongue every 5 (five) minutes as needed for chest pain.  . rosuvastatin (CRESTOR) 20 MG tablet Take 1 tablet (20 mg total) by mouth daily at 6 PM.  . sertraline (ZOLOFT) 25 MG tablet Take 25 mg by mouth daily.   . tamsulosin (FLOMAX) 0.4 MG CAPS capsule Take 0.4 mg by mouth at bedtime.   . traMADol-acetaminophen (ULTRACET) 37.5-325 MG per tablet Take 1 tablet by mouth every 6 (six) hours as needed for moderate pain.   Marland Kitchen warfarin (COUMADIN) 5 MG tablet Take 5 mg by mouth daily.      Allergies:   Lisinopril; Ticagrelor; Cephalexin; Prednisone; and Simvastatin   Social History   Socioeconomic History  . Marital status: Widowed    Spouse name: Not on file  . Number of children: Not on file  . Years of education: Not on file  . Highest education level: Not on file  Occupational History  . 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 Financial resource strain: Not on file  . Food insecurity:    Worry: Not on file    Inability: Not on file  . Transportation needs:    Medical: Not on file    Non-medical: Not on file  Tobacco Use  . Smoking status: Former Smoker    Packs/day: 0.10    Years: 30.00    Pack years: 3.00    Types: Cigarettes    Last attempt to quit: 12/15/1983    Years since quitting: 34.7  . Smokeless tobacco: Former Systems developer     Types: Walker date: 09/07/1983  Substance and Sexual Activity  . Alcohol use: No  . Drug use: No  . Sexual activity: Not on file  Lifestyle  . Physical activity:    Days per week: Not on file    Minutes per session: Not on file  . Stress: Not on file  Relationships  . Social connections:    Talks on phone: Not on file    Gets together: Not on file    Attends religious service: Not on file    Active member of club or organization: Not on file    Attends meetings  of clubs or organizations: Not on file    Relationship status: Not on file  Other Topics Concern  . Not on file  Social History Narrative  . Not on file     Family History: The patient's family history includes Heart disease in his sister.  ROS:   Please see the history of present illness.    All other systems reviewed and are negative.  EKGs/Labs/Other Studies Reviewed:    The following studies were reviewed today: I reviewed [facility] hospital records and answered questions to his satisfaction.   Recent Labs: 04/14/2018: BUN 26; Creatinine, Ser 1.31; Hemoglobin 11.2; Platelets 161; Potassium 4.6; Sodium 144  Recent Lipid Panel    Component Value Date/Time   CHOL 130 01/05/2018 0211   TRIG 61 01/05/2018 0211   HDL 34 (L) 01/05/2018 0211   CHOLHDL 3.8 01/05/2018 0211   VLDL 12 01/05/2018 0211   LDLCALC 84 01/05/2018 0211    Physical Exam:    VS:  BP (!) 150/62   Pulse 68   Ht 6' (1.829 m)   Wt 189 lb (85.7 kg)   SpO2 98%   BMI 25.63 kg/m     Wt Readings from Last 3 Encounters:  09/14/18 189 lb (85.7 kg)  06/27/18 191 lb 9.6 oz (86.9 kg)  04/14/18 190 lb (86.2 kg)     GEN: Patient is in no acute distress HEENT: Normal NECK: No JVD; No carotid bruits LYMPHATICS: No lymphadenopathy CARDIAC: Hear sounds regular, 2/6 systolic murmur at the apex. RESPIRATORY:  Clear to auscultation without rales, wheezing or rhonchi  ABDOMEN: Soft, non-tender, non-distended MUSCULOSKELETAL:  No edema; No  deformity  SKIN: Warm and dry NEUROLOGIC:  Alert and oriented x 3 PSYCHIATRIC:  Normal affect   Signed, Jenean Lindau, MD  09/14/2018 12:08 PM    [facility]

## 2018-09-15 DIAGNOSIS — Z8679 Personal history of other diseases of the circulatory system: Secondary | ICD-10-CM | POA: Insufficient documentation

## 2018-09-25 ENCOUNTER — Encounter: Payer: Self-pay | Admitting: Cardiology

## 2018-11-13 ENCOUNTER — Other Ambulatory Visit: Payer: Self-pay | Admitting: Cardiology

## 2018-11-14 ENCOUNTER — Telehealth: Payer: Self-pay

## 2018-11-14 NOTE — Telephone Encounter (Signed)
30 day supply of carvedilol sent per patient request. Pharmacy note included to have patient call office to schedule 3 mo follow up prior to future refills being approved.

## 2018-12-06 ENCOUNTER — Telehealth: Payer: Self-pay

## 2018-12-06 ENCOUNTER — Telehealth: Payer: Self-pay | Admitting: Cardiology

## 2018-12-06 NOTE — Telephone Encounter (Signed)
Called patient and left message to call office ot schedule Televisit or reschedule if no issues/LBW

## 2018-12-06 NOTE — Telephone Encounter (Signed)
RN called patient to discuss upcoming appt

## 2018-12-07 ENCOUNTER — Other Ambulatory Visit: Payer: Self-pay | Admitting: Cardiology

## 2018-12-11 ENCOUNTER — Telehealth: Payer: Self-pay

## 2018-12-11 NOTE — Telephone Encounter (Signed)
Patient does not want televist or any kind of virtual visit. 3 mo appt moved out 6 weeks per Daughter Santiago Glad request.

## 2018-12-13 ENCOUNTER — Telehealth: Payer: Medicare Other | Admitting: Cardiology

## 2019-01-24 IMAGING — XA IR ANGIO INTRA EXTRACRAN SEL COM CAROTID INNOMINATE BILAT MOD SE
11 of 14 series · 12 of 24 positions shown · IV contrast (IODINE)
Comparison: CT angiogram of the neck of 03/29/2018.

CLINICAL DATA: Intermittent episodes of dizziness. Abnormal CT
angiogram of the neck.

EXAM:
BILATERAL COMMON CAROTID AND INNOMINATE ANGIOGRAPHY; IR ANGIO
VERTEBRAL SEL VERTEBRAL UNI LEFT MOD SED; IR ANGIO VERTEBRAL SEL
SUBCLAVIAN INNOMINATE UNI RIGHT MOD SED
TECHNIQUE: Informed written consent was obtained from the patient after a
thorough discussion of the procedural risks, benefits and
alternatives. All questions were addressed. Maximal Sterile Barrier
Technique was utilized including caps, mask, sterile gowns, sterile
gloves, sterile drape, hand hygiene and skin antiseptic. A timeout
was performed prior to the initiation of the procedure.

[Series 2: cerebral · 2 acquisitions, 1 frame shown (1 of 10)]
[im 1/2]
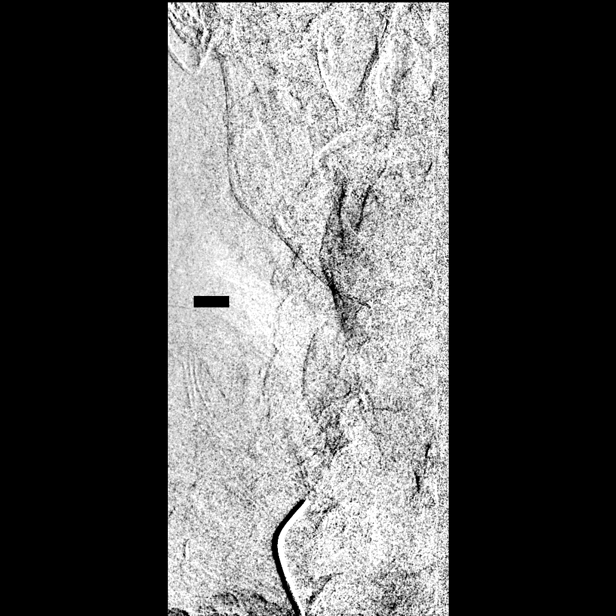

[Series 3: cerebral · 2 acquisitions, 1 frame shown (2 of 10)]
[im 1/2]
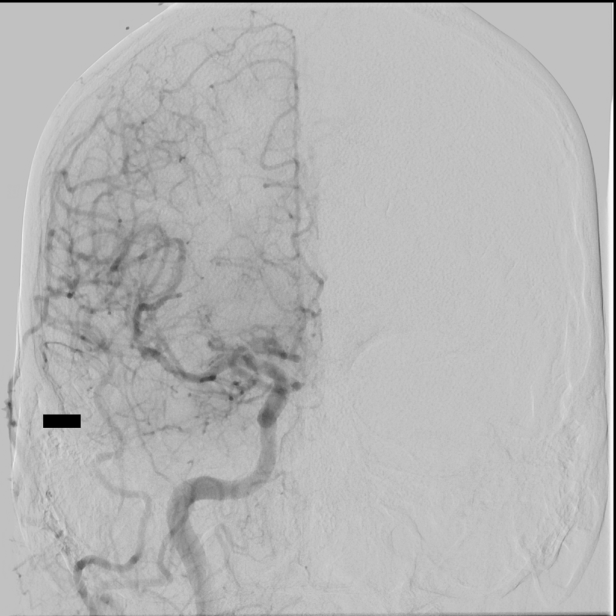

[Series 4: cerebral · 2 acquisitions, 1 frame shown (3 of 10)]
[im 1/2]
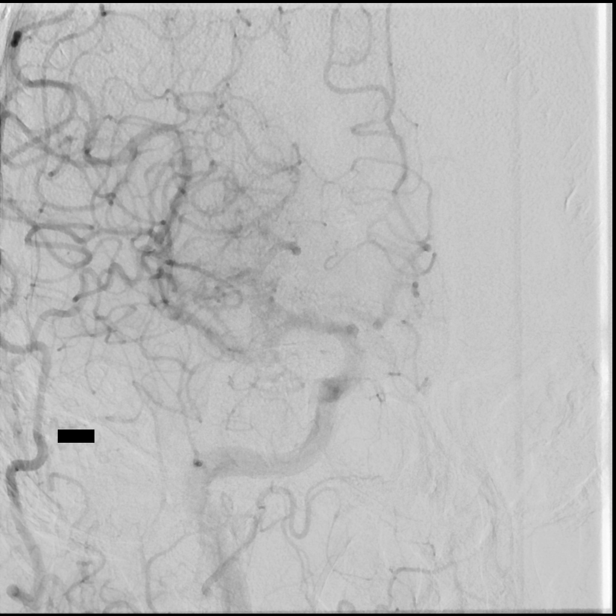

[Series 5: cerebral · 2 acquisitions, 1 frame shown (4 of 10)]
[im 1/2]
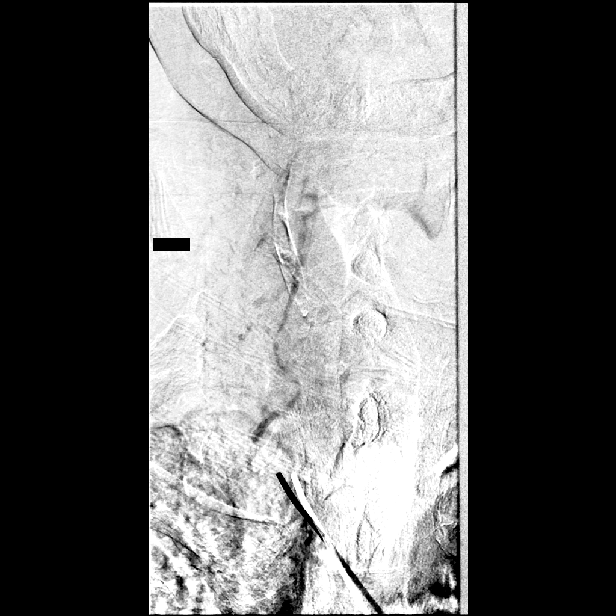

[Series 7: cerebral · 2 acquisitions, 1 frame shown (5 of 10)]
[im 1/2]
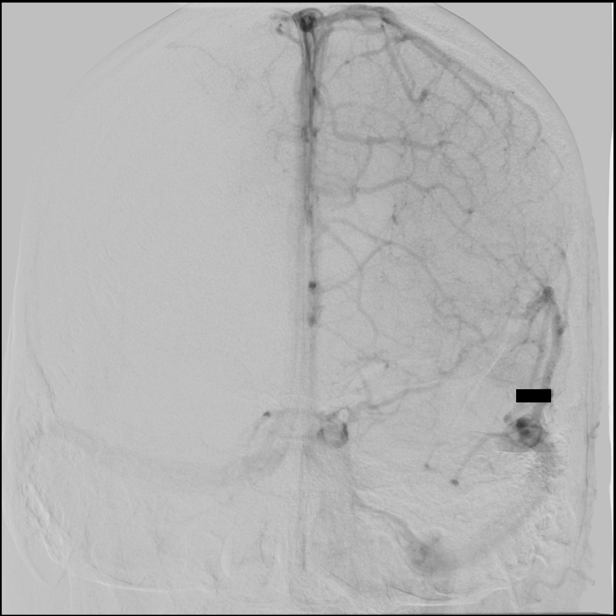

[Series 9: cerebral · 2 acquisitions, 1 frame shown (6 of 10)]
[im 1/2]
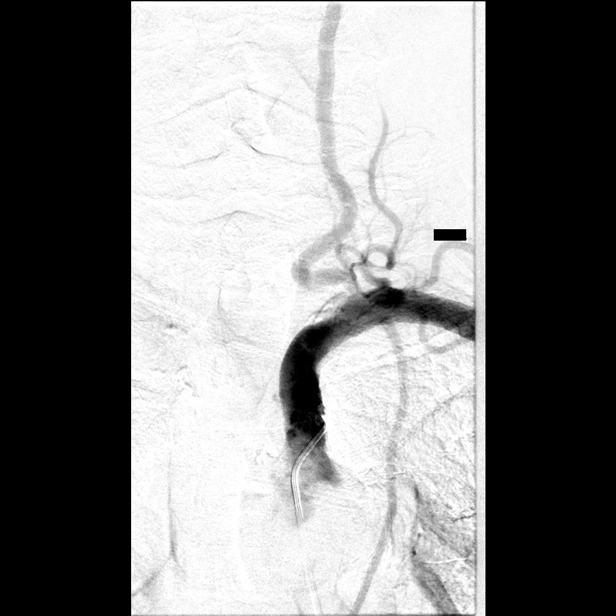

[Series 11: cerebral · 2 acquisitions, 1 frame shown (7 of 10)]
[im 1/2]
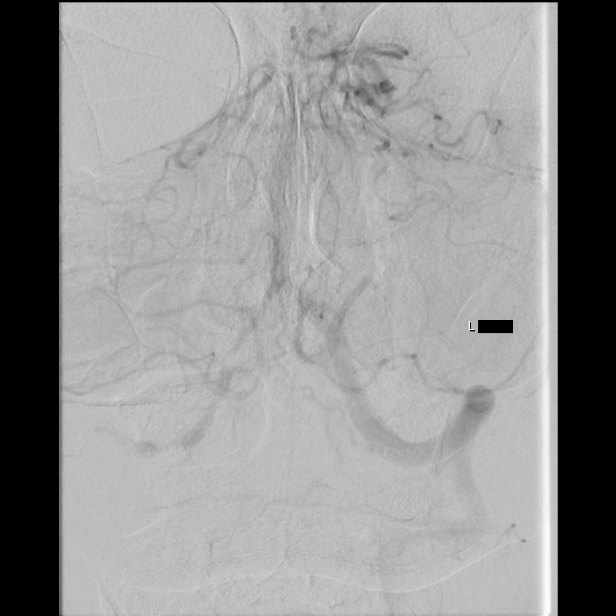

[Series 12: cerebral · 2 acquisitions, 1 frame shown (8 of 10)]
[im 1/2]
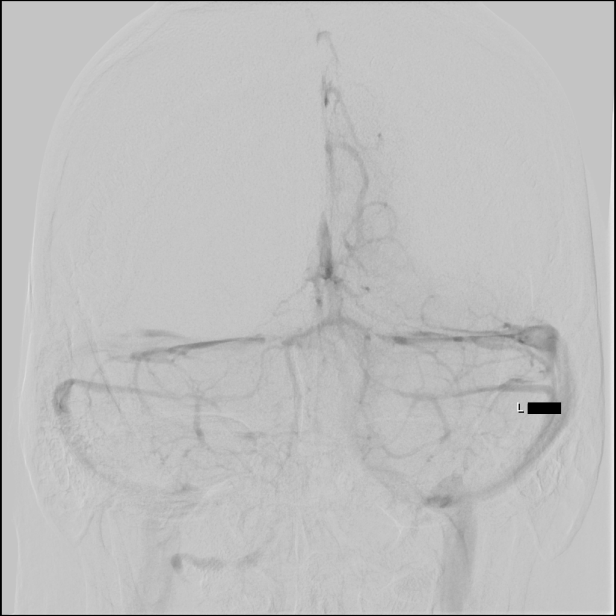

[Series 14: cerebral · 1 of 11 frames shown (9 of 10)]
[frame 6/11]
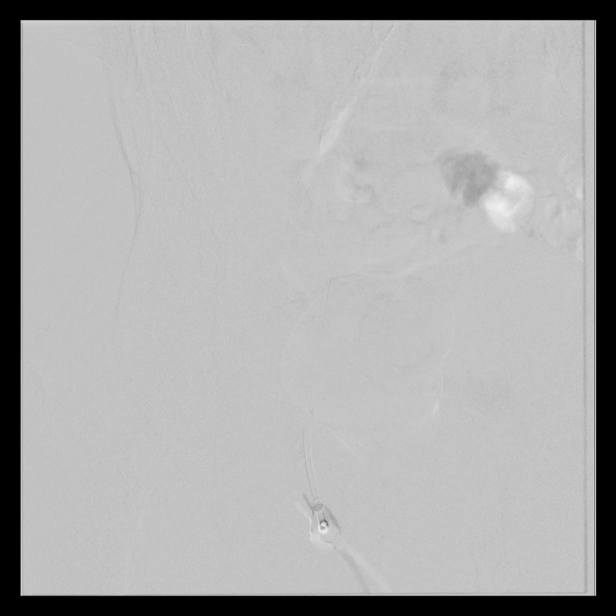

[Series 15: cerebral · 2 acquisitions, 1 frame shown (10 of 10)]
[im 1/2]
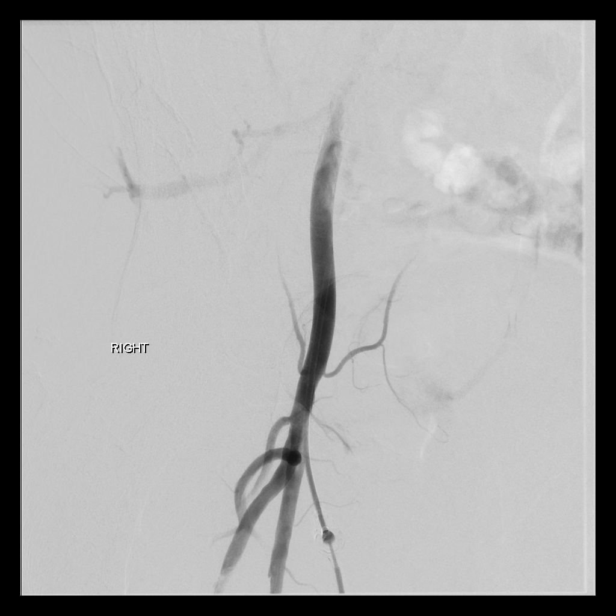

[Series 300: dr. (person_name) · 2 of 155 slices shown]
[im 52/155]
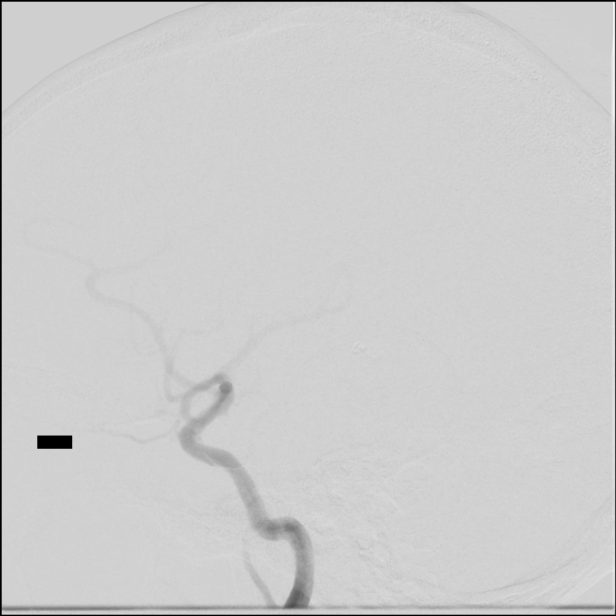
[im 137/155]
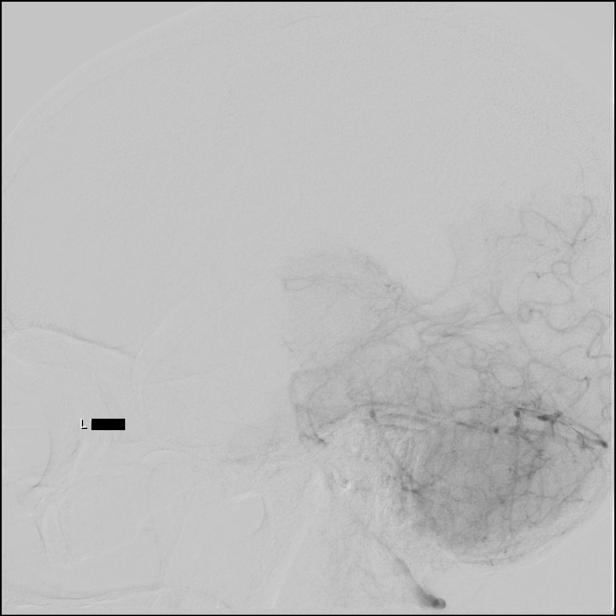

[12 of 24 positions shown; findings below may reference images not displayed]

MRI of the
brain of 01/20/2018.

MEDICATIONS:
Heparin 4777 units IV; no antibiotic was administered within 1 hour
of the procedure.

ANESTHESIA/SEDATION:
Versed 1 mg IV; Fentanyl 25 mcg IV.

Moderate Sedation Time:  25 minutes.

The patient was continuously monitored during the procedure by the
interventional radiology nurse under my direct supervision.

CONTRAST:  Isovue 300 approximately 55 mL.

FLUOROSCOPY TIME:  Fluoroscopy Time: 8 minutes 24 seconds (968 mGy).

COMPLICATIONS:
None immediate.
The right groin was prepped and draped in the usual sterile fashion.
Thereafter using modified Seldinger technique, transfemoral access
into the right common femoral artery was obtained without
difficulty. Over a 0.035 inch guidewire, a 5 French Pinnacle sheath
was inserted. Through this, and also over 0.035 inch guidewire, a 5
French JB 1 catheter was advanced to the aortic arch region and
selectively positioned in the right common carotid artery, the right
subclavian artery, the left common carotid artery and the left
vertebral artery.
FINDINGS: The right subclavian arteriogram demonstrates an occluded right
vertebral artery at its origin.

There is partial reconstitution of the vessel just distal to this
with incomplete flow to the cranial skull base. There is no
reconstitution from the ascending cervical branch the right
thyrocervical trunk.

The right common carotid arteriogram demonstrates a mild
circumferential smooth plaque in the right common carotid
bifurcation. There is approximately 50% stenosis of the right
external carotid artery at its origin. Its branches are normally
opacified.

There is minimal stenosis of the right internal carotid artery at
the bulb. No acute ulcerations or intra luminal filling defects are
seen.

More distally, there is normal opacification of the right internal
carotid artery to the cranial skull base. The petrous, cavernous and
supraclinoid segments are widely patent.

A right posterior communicating artery is seen opacifying the right
posterior cerebral artery distribution.

The right middle cerebral artery and the right anterior cerebral
artery opacify into the capillary and venous phases.

The left common carotid arteriogram demonstrates approximately 60%
stenosis at the proximal aspect of the left external carotid artery.

It's branches are normally opacified, otherwise.

The left internal carotid artery at the bulb demonstrates
approximately 60% stenosis proximally.

No acute ulcerations or intraluminal filling defects are seen.

More distally, the left internal carotid artery is seen to opacify
to the cranial skull base. There is wide patency of the left
internal carotid artery in the petrous, cavernous and supraclinoid
segments.

The left middle cerebral artery and the left anterior cerebral
artery opacify normally into the capillary and venous phases. The
dominant left vertebral artery at its origin has approximately 60%
stenosis. This is associated with mild tortuosity.

More distally, the vessel is seen to opacify to the cranial skull
base.

Wide patency is seen of the left vertebrobasilar junction and the
left posterior-inferior cerebellar artery.

The basilar artery, the left posterior cerebral arteries, the
superior cerebellar arteries and the anterior-inferior cerebellar
arteries opacify into the capillary and venous phases. There is
retrograde opacification of the right vertebrobasilar junction from
the left vertebral artery injection with a high-grade stenosis of
the left vertebrobasilar junction.

Nonvisualization of the right posterior cerebral artery is related
to dominant flow via the right posterior communicating artery from
the anterior circulation.
IMPRESSION: Approximately 60% stenosis at the origin of the dominant left
vertebral artery.

Angiographically occluded right vertebral artery at its origin
without distal reconstitution from the thyrocervical trunk branches.

Retrograde opacification of the right vertebrobasilar junction from
the left vertebral artery injection.

Approximately 60% stenosis of the proximal left internal carotid
artery, and approximately 60% stenosis of the proximal left external
carotid artery.

PLAN:
Angiographic findings were reviewed with the patient and the
patient's daughter.

Given the above angiographic findings, the patient was advised to
continue with his present medication. A follow-up CT angiogram of
the head and neck will be scheduled in approximately 3-4 months
time.

The patient and his daughter leave with good understanding and
agreement with the above management plan.

## 2019-02-06 ENCOUNTER — Telehealth: Payer: Self-pay | Admitting: Cardiology

## 2019-02-06 NOTE — Telephone Encounter (Signed)
Left message for patient to call the office to get approval for possible televisit with Revankar on 02/13/2019.

## 2019-02-13 ENCOUNTER — Telehealth: Payer: Self-pay | Admitting: Cardiology

## 2019-02-13 ENCOUNTER — Encounter: Payer: Self-pay | Admitting: Cardiology

## 2019-02-13 ENCOUNTER — Telehealth (INDEPENDENT_AMBULATORY_CARE_PROVIDER_SITE_OTHER): Payer: Medicare Other | Admitting: Cardiology

## 2019-02-13 ENCOUNTER — Other Ambulatory Visit: Payer: Self-pay

## 2019-02-13 VITALS — BP 152/72 | HR 73 | Ht 72.0 in | Wt 190.0 lb

## 2019-02-13 DIAGNOSIS — E782 Mixed hyperlipidemia: Secondary | ICD-10-CM

## 2019-02-13 DIAGNOSIS — I2510 Atherosclerotic heart disease of native coronary artery without angina pectoris: Secondary | ICD-10-CM

## 2019-02-13 DIAGNOSIS — E119 Type 2 diabetes mellitus without complications: Secondary | ICD-10-CM

## 2019-02-13 DIAGNOSIS — I4819 Other persistent atrial fibrillation: Secondary | ICD-10-CM

## 2019-02-13 DIAGNOSIS — I340 Nonrheumatic mitral (valve) insufficiency: Secondary | ICD-10-CM

## 2019-02-13 DIAGNOSIS — Z7901 Long term (current) use of anticoagulants: Secondary | ICD-10-CM

## 2019-02-13 DIAGNOSIS — Z794 Long term (current) use of insulin: Secondary | ICD-10-CM

## 2019-02-13 DIAGNOSIS — I10 Essential (primary) hypertension: Secondary | ICD-10-CM

## 2019-02-13 NOTE — Addendum Note (Signed)
Addended by: Tarri Glenn on: 02/13/2019 04:44 PM   Modules accepted: Orders

## 2019-02-13 NOTE — Telephone Encounter (Signed)
Virtual Visit Pre-Appointment Phone Call  "(Name), I am calling you today to discuss your upcoming appointment. We are currently trying to limit exposure to the virus that causes COVID-19 by seeing patients at home rather than in the office."  1. "What is the BEST phone number to call the day of the visit?" - include this in appointment notes  2. Do you have or have access to (through a family member/friend) a smartphone with video capability that we can use for your visit?" a. If yes - list this number in appt notes as cell (if different from BEST phone #) and list the appointment type as a VIDEO visit in appointment notes b. If no - list the appointment type as a PHONE visit in appointment notes  3. Confirm consent - "In the setting of the current Covid19 crisis, you are scheduled for a (phone or video) visit with your provider on (date) at (time).  Just as we do with many in-office visits, in order for you to participate in this visit, we must obtain consent.  If you'd like, I can send this to your mychart (if signed up) or email for you to review.  Otherwise, I can obtain your verbal consent now.  All virtual visits are billed to your insurance company just like a normal visit would be.  By agreeing to a virtual visit, we'd like you to understand that the technology does not allow for your provider to perform an examination, and thus may limit your provider's ability to fully assess your condition. If your provider identifies any concerns that need to be evaluated in person, we will make arrangements to do so.  Finally, though the technology is pretty good, we cannot assure that it will always work on either your or our end, and in the setting of a video visit, we may have to convert it to a phone-only visit.  In either situation, we cannot ensure that we have a secure connection.  Are you willing to proceed?" STAFF: Did the patient verbally acknowledge consent to telehealth visit? Document  YES/NO here: YES  4. Advise patient to be prepared - "Two hours prior to your appointment, go ahead and check your blood pressure, pulse, oxygen saturation, and your weight (if you have the equipment to check those) and write them all down. When your visit starts, your provider will ask you for this information. If you have an Apple Watch or Kardia device, please plan to have heart rate information ready on the day of your appointment. Please have a pen and paper handy nearby the day of the visit as well."  5. Give patient instructions for MyChart download to smartphone OR Doximity/Doxy.me as below if video visit (depending on what platform provider is using)  6. Inform patient they will receive a phone call 15 minutes prior to their appointment time (may be from unknown caller ID) so they should be prepared to answer    TELEPHONE CALL NOTE  Marc [name] has been deemed a candidate for a follow-up tele-health visit to limit community exposure during the Covid-19 pandemic. I spoke with the patient via phone to ensure availability of phone/video source, confirm preferred email & phone number, and discuss instructions and expectations.  I reminded Marc [name] to be prepared with any vital sign and/or heart rhythm information that could potentially be obtained via home monitoring, at the time of his visit. I reminded Marc [name] to expect a phone call prior to  his visit.  Marc [name] 02/13/2019 11:27 AM   INSTRUCTIONS FOR DOWNLOADING THE MYCHART APP TO SMARTPHONE  - The patient must first make sure to have activated MyChart and know their login information - If Apple, go to [employer] and type in MyChart in the search bar and download the app. If Android, ask patient to go to [employer] and type in [place] in the search bar and download the app. The app is free but as with any other app downloads, their phone may require them to verify saved payment information or Apple/Android  password.  - The patient will need to then log into the app with their MyChart username and password, and select [facility] as their healthcare provider to link the account. When it is time for your visit, go to the MyChart app, find appointments, and click Begin Video Visit. Be sure to Select Allow for your device to access the Microphone and Camera for your visit. You will then be connected, and your provider will be with you shortly.  **If they have any issues connecting, or need assistance please contact MyChart service desk [phone])**  **If using a computer, in order to ensure the best quality for their visit they will need to use either of the following Internet Browsers: [employer], or Google Chrome**  IF USING DOXIMITY or DOXY.ME - The patient will receive a link just prior to their visit by text.     FULL LENGTH CONSENT FOR TELE-HEALTH VISIT   I hereby voluntarily request, consent and authorize [facility] and its employed or contracted physicians, physician assistants, nurse practitioners or other licensed health care professionals (the Practitioner), to provide me with telemedicine health care services (the Services") as deemed necessary by the treating Practitioner. I acknowledge and consent to receive the Services by the Practitioner via telemedicine. I understand that the telemedicine visit will involve communicating with the Practitioner through live audiovisual communication technology and the disclosure of certain medical information by electronic transmission. I acknowledge that I have been given the opportunity to request an in-person assessment or other available alternative prior to the telemedicine visit and am voluntarily participating in the telemedicine visit.  I understand that I have the right to withhold or withdraw my consent to the use of telemedicine in the course of my care at any time, without affecting my right to future care or treatment,  and that the Practitioner or I may terminate the telemedicine visit at any time. I understand that I have the right to inspect all information obtained and/or recorded in the course of the telemedicine visit and may receive copies of available information for a reasonable fee.  I understand that some of the potential risks of receiving the Services via telemedicine include:   Delay or interruption in medical evaluation due to technological equipment failure or disruption;  Information transmitted may not be sufficient (e.g. poor resolution of images) to allow for appropriate medical decision making by the Practitioner; and/or   In rare instances, security protocols could fail, causing a breach of personal health information.  Furthermore, I acknowledge that it is my responsibility to provide information about my medical history, conditions and care that is complete and accurate to the best of my ability. I acknowledge that Practitioner's advice, recommendations, and/or decision may be based on factors not within their control, such as incomplete or inaccurate data provided by me or distortions of diagnostic images or specimens that may result from electronic transmissions. I  understand that the practice of medicine is not an exact science and that Practitioner makes no warranties or guarantees regarding treatment outcomes. I acknowledge that I will receive a copy of this consent concurrently upon execution via email to the email address I last provided but may also request a printed copy by calling the office of [facility].    I understand that my insurance will be billed for this visit.   I have read or had this consent read to me.  I understand the contents of this consent, which adequately explains the benefits and risks of the Services being provided via telemedicine.   I have been provided ample opportunity to ask questions regarding this consent and the Services and have had my questions  answered to my satisfaction.  I give my informed consent for the services to be provided through the use of telemedicine in my medical care  By participating in this telemedicine visit I agree to the above.

## 2019-02-13 NOTE — Patient Instructions (Signed)

## 2019-02-13 NOTE — Progress Notes (Signed)
Cardiology Office Note:    Date:  02/13/2019   ID:  [name], DOB 1941-08-04, MRN [id number]  PCP:  Raina Mina., MD  Cardiologist:  Jenean Lindau, MD   Referring MD: Raina Mina., MD    ASSESSMENT:    1. Persistent atrial fibrillation   2. Nonrheumatic mitral valve regurgitation   3. Mixed hyperlipidemia   4. Long term current use of anticoagulant therapy   5. Type 2 diabetes mellitus without complication, with long-term current use of insulin ([place])   6. Coronary artery disease involving native coronary artery of native heart without angina pectoris   7. Essential hypertension    PLAN:    In order of problems listed above:  1. Coronary artery disease: Secondary prevention stressed with the patient.  Importance of compliance with diet and medication stressed and he vocalized understanding.  He is now walking on a regular basis without any symptoms. 2. Atrial fibrillation: Persistent:I discussed with the patient atrial fibrillation, disease process. Management and therapy including rate and rhythm control, anticoagulation benefits and potential risks were discussed extensively with the patient. Patient had multiple questions which were answered to patient's satisfaction. 3. Essential hypertension: Blood pressure stable. 4. Mixed dyslipidemia: Lipids followed by his primary care physician.  He was encouraged to continue walking and an active lifestyle.  At least half an hour walking 5 days a week and he promises to comply 5. Patient will be seen in follow-up appointment in 3 months or earlier if the patient has any concerns    Medication Adjustments/Labs and Tests Ordered: Current medicines are reviewed at length with the patient today.  Concerns regarding medicines are outlined above.  No orders of the defined types were placed in this encounter.  No orders of the defined types were placed in this encounter.    No chief complaint on file.    History of Present  Illness:    Marc [name] is a 78 y.o. male.  Patient has past medical history of coronary artery disease, essential hypertension, dyslipidemia and atrial fibrillation on anticoagulation.  His warfarin is monitored by his primary care physician.  He denies any problems at this time and takes care of activities of daily living.  No chest pain orthopnea or PND.  He leads a sedentary lifestyle.  He mentions to me that he has been walking for a week without any problems.  At the time of my evaluation, the patient is alert awake oriented and in no distress.  Past Medical History:  Diagnosis Date  . AAA (abdominal aortic aneurysm) (HCC)    small. An abdominal ultrasound in 2011 showed no aneurysm  . ACS (acute coronary syndrome) ([facility])   . Arthritis   . Atrial fibrillation ([facility])    permanent  . Coronary artery disease   . Diabetes mellitus without complication ([place])   . Hyperlipidemia   . Hypertension   . Mitral regurgitation    with MVP. Mild to moderate  . Myocardial infarction ([place])   . Renal artery stenosis ([place])   . 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 Treated medically.   . Sexual dysfunction     Past Surgical History:  Procedure Laterality Date  . CARDIAC CATHETERIZATION  2006   showed an occluded Left circumflex with collaterals as well as 60% mid right coronary artery stenosis  . CARDIAC CATHETERIZATION  02/2011   LM: 20%, LAD: calcified 20%, LCX:  occluded distal AV groove supplying a small area, RCA: calcified with 50-60% proximal-mid disease  . CIRCUMCISION    . FOOT SURGERY Right   . IR ANGIO INTRA EXTRACRAN SEL COM CAROTID INNOMINATE BILAT MOD SED  04/14/2018  . IR ANGIO VERTEBRAL SEL SUBCLAVIAN INNOMINATE UNI R MOD SED  04/14/2018  . IR ANGIO VERTEBRAL SEL VERTEBRAL UNI L MOD SED  04/14/2018  . LEFT HEART CATH AND CORONARY ANGIOGRAPHY N/A 01/04/2018   Procedure: LEFT HEART CATH AND CORONARY ANGIOGRAPHY;  Surgeon: Wellington Hampshire, MD;  Location: [facility];  Service: Cardiovascular;  Laterality: N/A;    Current Medications: Current Meds  Medication Sig  . carvedilol (COREG) 25 MG tablet TAKE 1 TABLET BY MOUTH TWICE DAILY WITH A MEAL  . Coenzyme Q10 (COQ10) 100 MG CAPS Take 100 mg by mouth daily.  Marland Kitchen diltiazem (CARDIZEM CD) 300 MG 24 hr capsule Take 300 mg by mouth daily.      Allergies:   Lisinopril; Ticagrelor; Cephalexin; Prednisone; and Simvastatin   Social History   Socioeconomic History  . Marital status: Widowed    Spouse name: Not on file  . Number of children: Not on file  . Years of education: Not on file  . Highest education level: Not on file  Occupational History  . 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 Financial resource strain: Not on file  . Food insecurity:    Worry: Not on file    Inability: Not on file  . Transportation needs:    Medical: Not on file    Non-medical: Not on file  Tobacco Use  . Smoking status: Former Smoker    Packs/day: 0.10    Years: 30.00    Pack years: 3.00    Types: Cigarettes    Last attempt to quit: 12/15/1983    Years since quitting: 35.1  . Smokeless tobacco: Former Systems developer    Types: [place] date: 09/07/1983  Substance and Sexual Activity  . Alcohol use: No  . Drug use: No  . Sexual activity: Not on file  Lifestyle  . Physical activity:    Days per week: Not on file    Minutes per session: Not on file  . Stress: Not on file  Relationships  . Social connections:    Talks on phone: Not on file    Gets together: Not on file    Attends religious service: Not on file    Active member of club or organization: Not on file    Attends meetings of clubs or organizations: Not on file    Relationship status: Not on file  Other Topics Concern  . Not on file  Social History Narrative  . Not on file     Family History: The patient's family history includes Heart disease in his sister.  ROS:   Please  see the history of present illness.    All other systems reviewed and are negative.  EKGs/Labs/Other Studies Reviewed:    The following studies were reviewed today: As mentioned above.   Recent Labs: 04/14/2018: BUN 26; Creatinine, Ser 1.31; Hemoglobin 11.2; Platelets 161; Potassium 4.6; Sodium 144  Recent Lipid Panel    Component Value Date/Time   CHOL 130 01/05/2018 0211   TRIG 61 01/05/2018 0211   HDL 34 (L) 01/05/2018 0211   CHOLHDL 3.8 01/05/2018 0211   VLDL 12 01/05/2018 0211   LDLCALC 84 01/05/2018 0211    Physical  Exam:    VS:  BP (!) 152/72 (BP Location: Right Arm, Patient Position: Sitting, Cuff Size: Normal)   Pulse 73   Ht 6' (1.829 m)   Wt 190 lb (86.2 kg)   SpO2 98%   BMI 25.77 kg/m     Wt Readings from Last 3 Encounters:  02/13/19 190 lb (86.2 kg)  09/14/18 189 lb (85.7 kg)  06/27/18 191 lb 9.6 oz (86.9 kg)     GEN: Patient is in no acute distress HEENT: Normal NECK: No JVD; No carotid bruits LYMPHATICS: No lymphadenopathy CARDIAC: Hear sounds regular, 2/6 systolic murmur at the apex. RESPIRATORY:  Clear to auscultation without rales, wheezing or rhonchi  ABDOMEN: Soft, non-tender, non-distended MUSCULOSKELETAL:  No edema; No deformity  SKIN: Warm and dry NEUROLOGIC:  Alert and oriented x 3 PSYCHIATRIC:  Normal affect   Signed, Jenean Lindau, MD  02/13/2019 12:23 PM    [facility]

## 2019-05-05 ENCOUNTER — Other Ambulatory Visit: Payer: Self-pay

## 2019-05-05 ENCOUNTER — Inpatient Hospital Stay (HOSPITAL_COMMUNITY)
Admission: EM | Admit: 2019-05-05 | Discharge: 2019-05-08 | DRG: 287 | Disposition: A | Discharge status: Home / Self Care | Payer: Medicare Other | Attending: Student in an Organized Health Care Education/Training Program | Admitting: Student in an Organized Health Care Education/Training Program

## 2019-05-05 ENCOUNTER — Emergency Department (HOSPITAL_COMMUNITY): Payer: Medicare Other

## 2019-05-05 DIAGNOSIS — R079 Chest pain, unspecified: Secondary | ICD-10-CM | POA: Diagnosis not present

## 2019-05-05 DIAGNOSIS — N183 Chronic kidney disease, stage 3 unspecified: Secondary | ICD-10-CM | POA: Diagnosis present

## 2019-05-05 DIAGNOSIS — E785 Hyperlipidemia, unspecified: Secondary | ICD-10-CM | POA: Diagnosis present

## 2019-05-05 DIAGNOSIS — Z794 Long term (current) use of insulin: Secondary | ICD-10-CM

## 2019-05-05 DIAGNOSIS — I252 Old myocardial infarction: Secondary | ICD-10-CM

## 2019-05-05 DIAGNOSIS — Z8249 Family history of ischemic heart disease and other diseases of the circulatory system: Secondary | ICD-10-CM

## 2019-05-05 DIAGNOSIS — E119 Type 2 diabetes mellitus without complications: Secondary | ICD-10-CM

## 2019-05-05 DIAGNOSIS — I130 Hypertensive heart and chronic kidney disease with heart failure and stage 1 through stage 4 chronic kidney disease, or unspecified chronic kidney disease: Secondary | ICD-10-CM | POA: Diagnosis present

## 2019-05-05 DIAGNOSIS — I4821 Permanent atrial fibrillation: Secondary | ICD-10-CM | POA: Diagnosis present

## 2019-05-05 DIAGNOSIS — I701 Atherosclerosis of renal artery: Secondary | ICD-10-CM | POA: Diagnosis present

## 2019-05-05 DIAGNOSIS — I10 Essential (primary) hypertension: Secondary | ICD-10-CM | POA: Diagnosis present

## 2019-05-05 DIAGNOSIS — I2582 Chronic total occlusion of coronary artery: Secondary | ICD-10-CM | POA: Diagnosis present

## 2019-05-05 DIAGNOSIS — Z881 Allergy status to other antibiotic agents status: Secondary | ICD-10-CM

## 2019-05-05 DIAGNOSIS — Z79899 Other long term (current) drug therapy: Secondary | ICD-10-CM

## 2019-05-05 DIAGNOSIS — I25110 Atherosclerotic heart disease of native coronary artery with unstable angina pectoris: Principal | ICD-10-CM | POA: Diagnosis present

## 2019-05-05 DIAGNOSIS — E1122 Type 2 diabetes mellitus with diabetic chronic kidney disease: Secondary | ICD-10-CM | POA: Diagnosis present

## 2019-05-05 DIAGNOSIS — I249 Acute ischemic heart disease, unspecified: Secondary | ICD-10-CM | POA: Diagnosis present

## 2019-05-05 DIAGNOSIS — I341 Nonrheumatic mitral (valve) prolapse: Secondary | ICD-10-CM | POA: Diagnosis present

## 2019-05-05 DIAGNOSIS — I2510 Atherosclerotic heart disease of native coronary artery without angina pectoris: Secondary | ICD-10-CM | POA: Diagnosis present

## 2019-05-05 DIAGNOSIS — Z955 Presence of coronary angioplasty implant and graft: Secondary | ICD-10-CM

## 2019-05-05 DIAGNOSIS — I340 Nonrheumatic mitral (valve) insufficiency: Secondary | ICD-10-CM | POA: Diagnosis present

## 2019-05-05 DIAGNOSIS — I5022 Chronic systolic (congestive) heart failure: Secondary | ICD-10-CM | POA: Diagnosis present

## 2019-05-05 DIAGNOSIS — E782 Mixed hyperlipidemia: Secondary | ICD-10-CM

## 2019-05-05 DIAGNOSIS — R778 Other specified abnormalities of plasma proteins: Secondary | ICD-10-CM

## 2019-05-05 DIAGNOSIS — J449 Chronic obstructive pulmonary disease, unspecified: Secondary | ICD-10-CM | POA: Diagnosis present

## 2019-05-05 DIAGNOSIS — Z87891 Personal history of nicotine dependence: Secondary | ICD-10-CM

## 2019-05-05 DIAGNOSIS — Z20828 Contact with and (suspected) exposure to other viral communicable diseases: Secondary | ICD-10-CM | POA: Diagnosis present

## 2019-05-05 DIAGNOSIS — Z7901 Long term (current) use of anticoagulants: Secondary | ICD-10-CM

## 2019-05-05 DIAGNOSIS — R072 Precordial pain: Secondary | ICD-10-CM

## 2019-05-05 DIAGNOSIS — R791 Abnormal coagulation profile: Secondary | ICD-10-CM | POA: Diagnosis present

## 2019-05-05 DIAGNOSIS — I4891 Unspecified atrial fibrillation: Secondary | ICD-10-CM | POA: Diagnosis present

## 2019-05-05 DIAGNOSIS — Z888 Allergy status to other drugs, medicaments and biological substances status: Secondary | ICD-10-CM

## 2019-05-05 DIAGNOSIS — M1990 Unspecified osteoarthritis, unspecified site: Secondary | ICD-10-CM | POA: Diagnosis present

## 2019-05-05 DIAGNOSIS — N179 Acute kidney failure, unspecified: Secondary | ICD-10-CM | POA: Diagnosis present

## 2019-05-05 DIAGNOSIS — I214 Non-ST elevation (NSTEMI) myocardial infarction: Secondary | ICD-10-CM

## 2019-05-05 DIAGNOSIS — K529 Noninfective gastroenteritis and colitis, unspecified: Secondary | ICD-10-CM | POA: Diagnosis present

## 2019-05-05 LAB — BASIC METABOLIC PANEL
Anion gap: 11 (ref 5–15)
BUN: 25 mg/dL — ABNORMAL HIGH (ref 8–23)
CO2: 22 mmol/L (ref 22–32)
Calcium: 9.8 mg/dL (ref 8.9–10.3)
Chloride: 108 mmol/L (ref 98–111)
Creatinine, Ser: 1.4 mg/dL — ABNORMAL HIGH (ref 0.61–1.24)
GFR calc Af Amer: 56 mL/min — ABNORMAL LOW (ref >60)
GFR calc non Af Amer: 48 mL/min — ABNORMAL LOW (ref >60)
Glucose, Bld: 164 mg/dL — ABNORMAL HIGH (ref 70–99)
Potassium: 4 mmol/L (ref 3.5–5.1)
Sodium: 141 mmol/L (ref 135–145)

## 2019-05-05 LAB — HEMOGLOBIN A1C
Hgb A1c MFr Bld: 7 % — ABNORMAL HIGH (ref 4.8–5.6)
Mean Plasma Glucose: 154.2 mg/dL

## 2019-05-05 LAB — CBC
HCT: 44.5 % (ref 39.0–52.0)
Hemoglobin: 14.7 g/dL (ref 13.0–17.0)
MCH: 29.6 pg (ref 26.0–34.0)
MCHC: 33 g/dL (ref 30.0–36.0)
MCV: 89.7 fL (ref 80.0–100.0)
Platelets: 148 10*3/uL — ABNORMAL LOW (ref 150–400)
RBC: 4.96 MIL/uL (ref 4.22–5.81)
RDW: 13.8 % (ref 11.5–15.5)
WBC: 6.4 10*3/uL (ref 4.0–10.5)
nRBC: 0 % (ref 0.0–0.2)

## 2019-05-05 LAB — PROTIME-INR
INR: 1.8 — ABNORMAL HIGH (ref 0.8–1.2)
Prothrombin Time: 20.9 seconds — ABNORMAL HIGH (ref 11.4–15.2)

## 2019-05-05 LAB — SARS CORONAVIRUS 2 BY RT PCR (HOSPITAL ORDER, PERFORMED IN ~~LOC~~ HOSPITAL LAB): SARS Coronavirus 2: NEGATIVE

## 2019-05-05 LAB — GLUCOSE, CAPILLARY: Glucose-Capillary: 223 mg/dL — ABNORMAL HIGH (ref 70–99)

## 2019-05-05 LAB — TROPONIN I (HIGH SENSITIVITY)
Troponin I (High Sensitivity): 32 ng/L — ABNORMAL HIGH (ref <18)
Troponin I (High Sensitivity): 60 ng/L — ABNORMAL HIGH (ref <18)

## 2019-05-05 MED ORDER — DILTIAZEM HCL ER COATED BEADS 300 MG PO CP24
300.0000 mg | ORAL_CAPSULE | Freq: Every day | ORAL | Status: DC
  Administered 2019-05-06: 09:00:00 300 mg via ORAL
  Filled 2019-05-05: qty 1

## 2019-05-05 MED ORDER — TAMSULOSIN HCL 0.4 MG PO CAPS
0.4000 mg | ORAL_CAPSULE | Freq: Every day | ORAL | Status: DC
  Administered 2019-05-05 – 2019-05-07 (×3): 0.4 mg via ORAL
  Filled 2019-05-05 (×3): qty 1

## 2019-05-05 MED ORDER — INSULIN ASPART 100 UNIT/ML ~~LOC~~ SOLN
0.0000 [IU] | Freq: Three times a day (TID) | SUBCUTANEOUS | Status: DC
  Administered 2019-05-06 – 2019-05-08 (×6): 1 [IU] via SUBCUTANEOUS
  Administered 2019-05-08: 2 [IU] via SUBCUTANEOUS

## 2019-05-05 MED ORDER — WARFARIN - PHARMACIST DOSING INPATIENT: Freq: Every day | Status: DC

## 2019-05-05 MED ORDER — WARFARIN SODIUM 2.5 MG PO TABS
2.5000 mg | ORAL_TABLET | Freq: Once | ORAL | Status: DC
  Filled 2019-05-05: qty 1

## 2019-05-05 MED ORDER — TRAMADOL-ACETAMINOPHEN 37.5-325 MG PO TABS: 1.0000 | ORAL_TABLET | Freq: Four times a day (QID) | ORAL | Status: DC | PRN

## 2019-05-05 MED ORDER — HEPARIN BOLUS VIA INFUSION
2000.0000 [IU] | Freq: Once | INTRAVENOUS | Status: DC
  Filled 2019-05-05: qty 2000

## 2019-05-05 MED ORDER — SERTRALINE HCL 50 MG PO TABS
25.0000 mg | ORAL_TABLET | Freq: Every day | ORAL | Status: DC
  Administered 2019-05-06 – 2019-05-08 (×3): 25 mg via ORAL
  Filled 2019-05-05 (×4): qty 1

## 2019-05-05 MED ORDER — CARVEDILOL 25 MG PO TABS
25.0000 mg | ORAL_TABLET | Freq: Two times a day (BID) | ORAL | Status: DC
  Administered 2019-05-05 – 2019-05-07 (×5): 25 mg via ORAL
  Filled 2019-05-05 (×5): qty 2
  Filled 2019-05-05: qty 1

## 2019-05-05 MED ORDER — WARFARIN SODIUM 2.5 MG PO TABS
2.5000 mg | ORAL_TABLET | Freq: Once | ORAL | Status: AC
  Administered 2019-05-05: 23:00:00 2.5 mg via ORAL
  Filled 2019-05-05 (×2): qty 1

## 2019-05-05 MED ORDER — CARVEDILOL 12.5 MG PO TABS: 25.0000 mg | ORAL_TABLET | Freq: Two times a day (BID) | ORAL | Status: DC

## 2019-05-05 MED ORDER — NITROGLYCERIN 0.4 MG SL SUBL: 0.4000 mg | SUBLINGUAL_TABLET | SUBLINGUAL | Status: DC | PRN

## 2019-05-05 MED ORDER — INSULIN ASPART 100 UNIT/ML ~~LOC~~ SOLN
0.0000 [IU] | Freq: Every day | SUBCUTANEOUS | Status: DC
  Administered 2019-05-05: 2 [IU] via SUBCUTANEOUS

## 2019-05-05 MED ORDER — ISOSORBIDE MONONITRATE ER 30 MG PO TB24
15.0000 mg | ORAL_TABLET | Freq: Every day | ORAL | Status: DC
  Administered 2019-05-05 – 2019-05-07 (×3): 15 mg via ORAL
  Filled 2019-05-05 (×3): qty 1

## 2019-05-05 MED ORDER — HEPARIN (PORCINE) 25000 UT/250ML-% IV SOLN: 1200.0000 [IU]/h | INTRAVENOUS | Status: DC

## 2019-05-05 NOTE — ED Notes (Signed)
ED TO INPATIENT HANDOFF REPORT  ED Nurse Name and Phone #: Cyd Silence, RN  S Name/Age/Gender Marc [name] 78 y.o. male Room/Bed: 041C/041C  Code Status   Code Status: DNR  Home/SNF/Other Home Patient oriented to: self, place, time and situation Is this baseline? Yes   Triage Complete: Triage complete  Chief Complaint chest pain  Triage Note Pt arrives to ED from home with complaints of chest pain since 1600. EMS reports pt noticed sudden onset chest pain on left side, non radiating. Prior to EMS arrival pt took 3 nitro 5 min apart with little relief. Pain 8/10. After EMS arrival he took 2 additional nitro. Pain now 0/10. EMS states pt has hx of MI, stents, Afib, HTN. Pt states he took his BP meds today. Pt states unable to take aspirin due to causing GI bleed. Pt does take warfarin. Pt placed in position of comfort with bed locked and lowered, call bell in reach.     Allergies Allergies  Allergen Reactions  . Lisinopril Shortness Of Breath       . Ticagrelor Shortness Of Breath and Other (See Comments)    * Brilinta* Pt short of breath  . Cephalexin Diarrhea and Nausea And Vomiting       . Prednisone Other (See Comments)    GI Issues  . Simvastatin Other (See Comments)    Unsure    Level of Care/Admitting Diagnosis ED Disposition    ED Disposition Condition Comment   Admit  Hospital Area: [facility] [[id number]]  Level of Care: Telemetry Cardiac [103]  Covid Evaluation: Asymptomatic Screening Protocol (No Symptoms)  Diagnosis: Chest pain [id number]  Admitting Physician: Aline Brochure  Attending Physician: Larey Dresser A [[id number]]  PT Class (Do Not Modify): Observation [104]  PT Acc Code (Do Not Modify): Observation [10022]       B Medical/Surgery History Past Medical History:  Diagnosis Date  . AAA (abdominal aortic aneurysm) (HCC)    small. An abdominal ultrasound in 2011 showed no aneurysm  . ACS (acute coronary  syndrome) ([place])   . Arthritis   . Atrial fibrillation ([place])    permanent  . Coronary artery disease   . Diabetes mellitus without complication ([facility])   . Hyperlipidemia   . Hypertension   . Mitral regurgitation    with MVP. Mild to moderate  . Myocardial infarction ([place])   . Renal artery stenosis ([facility])   . Renal artery stenosis in 1 of 2 vessels ([place]) 2006   subtotal occlusion of right renal artery. Treated medically.   . Sexual dysfunction    Past Surgical History:  Procedure Laterality Date  . CARDIAC CATHETERIZATION  2006   showed an occluded Left circumflex with collaterals as well as 60% mid right coronary artery stenosis  . CARDIAC CATHETERIZATION  02/2011   LM: 20%, LAD: calcified 20%, LCX: occluded distal AV groove supplying a small area, RCA: calcified with 50-60% proximal-mid disease  . CIRCUMCISION    . FOOT SURGERY Right   . IR ANGIO INTRA EXTRACRAN SEL COM CAROTID INNOMINATE BILAT MOD SED  04/14/2018  . IR ANGIO VERTEBRAL SEL SUBCLAVIAN INNOMINATE UNI R MOD SED  04/14/2018  . IR ANGIO VERTEBRAL SEL VERTEBRAL UNI L MOD SED  04/14/2018  . LEFT HEART CATH AND CORONARY ANGIOGRAPHY N/A 01/04/2018   Procedure: LEFT HEART CATH AND CORONARY ANGIOGRAPHY;  Surgeon: Wellington Hampshire, MD;  Location: [facility];  Service: Cardiovascular;  Laterality: N/A;  A IV Location/Drains/Wounds Patient Lines/Drains/Airways Status   Active Line/Drains/Airways    Name:   Placement date:   Placement time:   Site:   Days:   Peripheral IV 01/03/18 Right Antecubital   01/03/18    2200    Antecubital   487   Peripheral IV 01/03/18 Left Forearm   01/03/18    2200    Forearm   487   Peripheral IV 05/05/19 Anterior;Left Forearm   05/05/19    -    Forearm   less than 1          Intake/Output Last 24 hours No intake or output data in the 24 hours ending 05/05/19 2038  Labs/Imaging Results for orders placed or performed during the hospital encounter of 05/05/19 (from the past 48 hour(s))   Basic metabolic panel     Status: Abnormal   Collection Time: 05/05/19  6:25 PM  Result Value Ref Range   Sodium 141 135 - 145 mmol/L   Potassium 4.0 3.5 - 5.1 mmol/L   Chloride 108 98 - 111 mmol/L   CO2 22 22 - 32 mmol/L   Glucose, Bld 164 (H) 70 - 99 mg/dL   BUN 25 (H) 8 - 23 mg/dL   Creatinine, Ser 1.40 (H) 0.61 - 1.24 mg/dL   Calcium 9.8 8.9 - 10.3 mg/dL   GFR calc non Af Amer 48 (L) >60 mL/min   GFR calc Af Amer 56 (L) >60 mL/min   Anion gap 11 5 - 15    Comment: Performed at [facility], [address] [place], [place] [address]  CBC     Status: Abnormal   Collection Time: 05/05/19  6:25 PM  Result Value Ref Range   WBC 6.4 4.0 - 10.5 K/uL   RBC 4.96 4.22 - 5.81 MIL/uL   Hemoglobin 14.7 13.0 - 17.0 g/dL   HCT 44.5 39.0 - 52.0 %   MCV 89.7 80.0 - 100.0 fL   MCH 29.6 26.0 - 34.0 pg   MCHC 33.0 30.0 - 36.0 g/dL   RDW 13.8 11.5 - 15.5 %   Platelets 148 (L) 150 - 400 K/uL   nRBC 0.0 0.0 - 0.2 %    Comment: Performed at [facility], [place] [address], [place], [place] [address]  Troponin I (High Sensitivity)     Status: Abnormal   Collection Time: 05/05/19  6:25 PM  Result Value Ref Range   Troponin I (High Sensitivity) 32 (H) <18 ng/L    Comment: (NOTE) Elevated high sensitivity troponin I (hsTnI) values and significant  changes across serial measurements may suggest ACS but many other  chronic and acute conditions are known to elevate hsTnI results.  Refer to the "Links" section for chest pain algorithms and additional  guidance. Performed at [facility], [place] [address], [place], [place] [address]   Protime-INR (order if Patient is taking Coumadin / Warfarin)     Status: Abnormal   Collection Time: 05/05/19  6:25 PM  Result Value Ref Range   Prothrombin Time 20.9 (H) 11.4 - 15.2 seconds   INR 1.8 (H) 0.8 - 1.2    Comment: (NOTE) INR goal varies based on device and disease states. Performed at [facility], [place] [address] [place],  [place] [address]    Dg Chest 2 View  Result Date: 05/05/2019 CLINICAL DATA:  Chest pain. EXAM: CHEST - 2 VIEW COMPARISON:  September 08, 2018 FINDINGS: Stable cardiomegaly. Hila and mediastinum  are normal. No pneumothorax. No overt edema. No focal infiltrate. IMPRESSION: No active cardiopulmonary disease. Electronically Signed   By: Dorise Bullion III M.D   On: 05/05/2019 19:20    Pending Labs Unresulted Labs (From admission, onward)    Start     Ordered   05/05/19 2013  Hemoglobin A1c  Once,   STAT    Comments: To assess prior glycemic control    05/05/19 2013   05/05/19 1913  SARS Coronavirus 2 [facility] order, Performed in [facility] hospital lab) Nasopharyngeal Nasopharyngeal Swab  (Symptomatic/High Risk of Exposure/Tier 1 Patients Labs with Precautions)  Once,   STAT    Question Answer Comment  Is this test for diagnosis or screening Screening   Symptomatic for COVID-19 as defined by CDC No   Hospitalized for COVID-19 No   Admitted to ICU for COVID-19 No   Previously tested for COVID-19 Unknown   Resident in a congregate (group) care setting No   Employed in healthcare setting No      05/05/19 1912          Vitals/Pain Today's Vitals   05/05/19 1819 05/05/19 1830 05/05/19 1939 05/05/19 2000  BP: (!) 198/116 (!) 179/115 (!) 160/92 (!) 206/83  Pulse: (!) 48 82 (!) 101 (!) 43  Resp: 19 12 14 15   Temp: 98.5 F (36.9 C)     TempSrc: Oral     SpO2: 99% 100% 97% 98%  PainSc: 0-No pain  2      Isolation Precautions No active isolations  Medications Medications  diltiazem (CARDIZEM CD) 24 hr capsule 300 mg (has no administration in time range)  isosorbide mononitrate (IMDUR) 24 hr tablet 15 mg (has no administration in time range)  nitroGLYCERIN (NITROSTAT) SL tablet 0.4 mg (has no administration in time range)  sertraline (ZOLOFT) tablet 25 mg (has no administration in time range)  tamsulosin (FLOMAX) capsule 0.4 mg (has no administration in time range)   traMADol-acetaminophen (ULTRACET) 37.5-325 MG per tablet 1 tablet (has no administration in time range)  insulin aspart (novoLOG) injection 0-9 Units (has no administration in time range)  insulin aspart (novoLOG) injection 0-5 Units (has no administration in time range)  carvedilol (COREG) tablet 25 mg (has no administration in time range)    Mobility walks Low fall risk   Focused Assessments Cardiac Assessment Handoff:  Cardiac Rhythm: Atrial fibrillation Lab Results  Component Value Date   TROPONINI 8.18 (HH) 01/04/2018   No results found for: DDIMER Does the Patient currently have chest pain? No     R Recommendations: See Admitting Provider Note  Report given to:   Additional Notes:

## 2019-05-05 NOTE — ED Provider Notes (Signed)
[facility] EMERGENCY DEPARTMENT Provider Note   CSN: [id number] Arrival date & time: 05/05/19  1807     History   Chief Complaint Chief Complaint  Patient presents with  . Chest Pain    HPI Marc [name] is a 78 y.o. male with past medical history of multivessel CAD, hypertension, hyperlipidemia, type 2 diabetes, atrial fibrillation on Coumadin, COPD, CKD, AAA, presenting to the emergency department with complaint of sudden onset of central chest tightness/heaviness that began while he was working out in the yard.  It began at 4 PM this evening.  He took 3 nitroglycerin at home with minimal relief.  He called EMS and 30 minutes later he took 2 more nitroglycerin which relieved his pain.  He states his pain is not as worse as his prior MIs though does feel similar.  He endorses associated diaphoresis.  He is noted to be hypertensive on arrival though states he did take his blood pressure medications today.  He states he has not missed any doses of Coumadin.  Per chart review, last cardiac catheterization was done in May 2019 with significant multivessel disease present. Pt followed by Dr. Geraldo Pitter    The history is provided by the patient and medical records.    Past Medical History:  Diagnosis Date  . AAA (abdominal aortic aneurysm) (HCC)    small. An abdominal ultrasound in 2011 showed no aneurysm  . ACS (acute coronary syndrome) ([place])   . Arthritis   . Atrial fibrillation ([place])    permanent  . Coronary artery disease   . Diabetes mellitus without complication ([place])   . Hyperlipidemia   . Hypertension   . Mitral regurgitation    with MVP. Mild to moderate  . Myocardial infarction ([place])   . Renal artery stenosis ([place])   . Renal artery stenosis in 1 of 2 vessels ([place]) 2006   subtotal occlusion of right renal artery. Treated medically.   . Sexual dysfunction     Patient Active Problem List   Diagnosis Date Noted  . History of renal artery stenosis  09/15/2018  . 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 Occlusion and stenosis of vertebral artery 02/13/2018  . Dizziness 02/13/2018  . Non-ST elevation (NSTEMI) myocardial infarction ([place])   . Acute pain of both knees 12/08/2016  . Cervical spine pain 12/08/2016  . Generalized anxiety disorder 01/14/2016  . Type 2 diabetes mellitus without complication, with long-term current use of insulin ([place]) 01/14/2016  . Chronic combined systolic and diastolic congestive heart failure ([place]) 01/13/2016  . Chronic kidney disease, stage III (moderate) ([place]) 01/13/2016  . Encounter for long-term (current) drug use 01/13/2016  . Gastroesophageal reflux disease without esophagitis 01/13/2016  . Hypercalcemia 01/13/2016  . Long term current use of anticoagulant therapy 01/13/2016  . Mixed hyperlipidemia 01/13/2016  . COPD (chronic obstructive pulmonary disease) ([place]) 09/30/2011  . Mitral regurgitation   . Atrial fibrillation ([place])   . Coronary artery disease   . Hypertension     Past Surgical History:  Procedure Laterality Date  . CARDIAC CATHETERIZATION  2006   showed an occluded Left circumflex with collaterals as well as 60% mid right coronary artery stenosis  . CARDIAC CATHETERIZATION  02/2011   LM: 20%, LAD: calcified 20%, LCX: occluded distal AV groove supplying a small area, RCA: calcified with 50-60% proximal-mid disease  . CIRCUMCISION    . FOOT SURGERY Right   . IR ANGIO INTRA EXTRACRAN SEL COM  CAROTID INNOMINATE BILAT MOD SED  04/14/2018  . IR ANGIO VERTEBRAL SEL SUBCLAVIAN INNOMINATE UNI R MOD SED  04/14/2018  . IR ANGIO VERTEBRAL SEL VERTEBRAL UNI L MOD SED  04/14/2018  . LEFT HEART CATH AND CORONARY ANGIOGRAPHY N/A 01/04/2018   Procedure: LEFT HEART CATH AND CORONARY ANGIOGRAPHY;  Surgeon: Wellington Hampshire, MD;  Location: [facility];  Service: Cardiovascular;  Laterality: N/A;        Home Medications    Prior to Admission  medications   Medication Sig Start Date End Date Taking? Authorizing Provider  carvedilol (COREG) 25 MG tablet TAKE 1 TABLET BY MOUTH TWICE DAILY WITH A MEAL 12/07/18   Revankar, Reita Cliche, MD  Coenzyme Q10 (COQ10) 100 MG CAPS Take 100 mg by mouth daily.    [provider]  diltiazem (CARDIZEM CD) 300 MG 24 hr capsule Take 300 mg by mouth daily.     [provider]  furosemide (LASIX) 20 MG tablet Take 1 tablet by mouth every other day. 12/07/18   [provider]  glipiZIDE (GLUCOTROL XL) 5 MG 24 hr tablet TAKE 1 TABLET BY MOUTH DAILY 07/18/17   [provider]  Insulin Glargine (LANTUS SOLOSTAR) 100 UNIT/ML Solostar Pen Inject 10 Units into the skin at bedtime.  01/10/17   [provider]  Insulin Pen Needle (NOVOFINE) 32G X 6 MM MISC USE AS DIRECTED. 10/20/16   [provider]  isosorbide mononitrate (IMDUR) 30 MG 24 hr tablet Take 0.5 tablets (15 mg total) by mouth daily. Patient taking differently: Take 15 mg by mouth at bedtime.  03/23/18   Revankar, Reita Cliche, MD  nitroGLYCERIN (NITROSTAT) 0.4 MG SL tablet Place 1 tablet (0.4 mg total) under the tongue every 5 (five) minutes as needed for chest pain. 01/06/18   Lyda Jester M, PA-C  sertraline (ZOLOFT) 25 MG tablet Take 25 mg by mouth daily.  01/09/18   [provider]  tamsulosin (FLOMAX) 0.4 MG CAPS capsule Take 0.4 mg by mouth at bedtime.  10/13/17   [provider]  traMADol-acetaminophen (ULTRACET) 37.5-325 MG per tablet Take 1 tablet by mouth every 6 (six) hours as needed for moderate pain.     [provider]  warfarin (COUMADIN) 5 MG tablet Take 5 mg by mouth daily.  02/20/18   [provider]    Family History Family History  Problem Relation Age of Onset  . Heart disease Sister     Social History Social History   Tobacco Use  . Smoking status: Former Smoker    Packs/day: 0.10    Years: 30.00    Pack years: 3.00    Types: Cigarettes    Quit  date: 12/15/1983    Years since quitting: 35.4  . Smokeless tobacco: Former Systems developer    Types: Chew    Quit date: 09/07/1983  Substance Use Topics  . Alcohol use: No  . Drug use: No     Allergies   Lisinopril, Ticagrelor, Cephalexin, Prednisone, and Simvastatin   Review of Systems Review of Systems  Constitutional: Positive for diaphoresis.  Respiratory: Positive for shortness of breath.   Cardiovascular: Positive for chest pain.  All other systems reviewed and are negative.    Physical Exam Updated Vital Signs BP (!) 160/92 (BP Location: Left Arm)   Pulse (!) 101   Temp 98.5 F (36.9 C) (Oral)   Resp 14   SpO2 97%   Physical Exam Vitals signs and nursing note reviewed.  Constitutional:  General: He is not in acute distress.    Appearance: He is well-developed. He is not ill-appearing.  HENT:     Head: Normocephalic and atraumatic.  Eyes:     Conjunctiva/sclera: Conjunctivae normal.  Neck:     Musculoskeletal: Normal range of motion and neck supple.  Cardiovascular:     Rate and Rhythm: Normal rate and regular rhythm.  Pulmonary:     Effort: Pulmonary effort is normal. No respiratory distress.     Breath sounds: Normal breath sounds.  Abdominal:     General: Bowel sounds are normal.     Palpations: Abdomen is soft.     Tenderness: There is no abdominal tenderness. There is no guarding or rebound.  Musculoskeletal:     Right lower leg: No edema.     Left lower leg: No edema.  Skin:    General: Skin is warm.  Neurological:     Mental Status: He is alert.  Psychiatric:        Behavior: Behavior normal.      ED Treatments / Results  Labs (all labs ordered are listed, but only abnormal results are displayed) Labs Reviewed  BASIC METABOLIC PANEL - Abnormal; Notable for the following components:      Result Value   Glucose, Bld 164 (*)    BUN 25 (*)    Creatinine, Ser 1.40 (*)    GFR calc non Af Amer 48 (*)    GFR calc Af Amer 56 (*)    All other  components within normal limits  CBC - Abnormal; Notable for the following components:   Platelets 148 (*)    All other components within normal limits  PROTIME-INR - Abnormal; Notable for the following components:   Prothrombin Time 20.9 (*)    INR 1.8 (*)    All other components within normal limits  TROPONIN I (HIGH SENSITIVITY) - Abnormal; Notable for the following components:   Troponin I (High Sensitivity) 32 (*)    All other components within normal limits  SARS CORONAVIRUS 2 (HOSPITAL ORDER, [facility])  TROPONIN I (HIGH SENSITIVITY)    EKG EKG Interpretation  Date/Time:  Saturday May 05 2019 18:10:56 EDT Ventricular Rate:  89 PR Interval:    QRS Duration: 99 QT Interval:  370 QTC Calculation: 451 R Axis:   42 Text Interpretation:  Atrial fibrillation Probable anterior infarct, old since last tracing no significant change Confirmed by Malvin Johns [phone]) on 05/05/2019 7:14:49 PM   Radiology Dg Chest 2 View  Result Date: 05/05/2019 CLINICAL DATA:  Chest pain. EXAM: CHEST - 2 VIEW COMPARISON:  September 08, 2018 FINDINGS: Stable cardiomegaly. Hila and mediastinum are normal. No pneumothorax. No overt edema. No focal infiltrate. IMPRESSION: No active cardiopulmonary disease. Electronically Signed   By: Dorise Bullion III M.D   On: 05/05/2019 19:20    Procedures Procedures (including critical care time)  Medications Ordered in ED Medications - No data to display   Initial Impression / Assessment and Plan / ED Course  I have reviewed the triage vital signs and the nursing notes.  Pertinent labs & imaging results that were available during my care of the patient were reviewed by me and considered in my medical decision making (see chart for details).  Clinical Course as of May 04 2010  Sat May 05, 2019  1956 IM accepting admission.   [JR]    Clinical Course User Index [JR] Rasul Decola, [place] N, PA-C  Patient with known  multivessel CAD, multiple risk factors, presenting to the emergency department with chest pain that began during exertion, relieved with nitroglycerin.  He also has history of chronic A. fib on Coumadin.  He has normal heart rate today.  Noted to be hypertensive upon arrival though with some in gradual improvement.  He endorses compliance with his daily medications, including blood pressure medications and Coumadin.  His initial EKG shows no acute ischemic changes though his troponin is elevated at 32.  Electrolytes appear to be at patient's baseline.  Patient has a heart pathway score of 6.  His presentation is concerning for cardiac etiology of symptoms.  Will admit for further management.  Patient discussed with Dr. Tamera Punt, who agrees with care plan for admission.  The patient appears reasonably stabilized for admission considering the current resources, flow, and capabilities available in the ED at this time, and I doubt any other [facility] requiring further screening and/or treatment in the ED prior to admission.  Final Clinical Impressions(s) / ED Diagnoses   Final diagnoses:  Substernal chest pain  Elevated troponin I level    ED Discharge Orders    None       Khamari Yousuf, [place] N, PA-C 05/05/19 2012    Malvin Johns, MD 05/06/19 [phone]

## 2019-05-05 NOTE — Progress Notes (Addendum)
[facility] for Warfarin Indication: atrial fibrillation / chest pain   Assessment: 26 yom on warfarin PTA for afib presenting with CP.  INR on admission 1.8  Dose prior to admission = 5 mg po daily    Goal of Therapy:  INR 2 to 3 Monitor platelets by anticoagulation protocol: Yes   Plan:  Extra Coumadin 2.5 mg po x 1 tonight Daily INR  Thank you Anette Guarneri, PharmD

## 2019-05-05 NOTE — ED Triage Notes (Signed)
Pt arrives to ED from home with complaints of chest pain since 1600. EMS reports pt noticed sudden onset chest pain on left side, non radiating. Prior to EMS arrival pt took 3 nitro 5 min apart with little relief. Pain 8/10. After EMS arrival he took 2 additional nitro. Pain now 0/10. EMS states pt has hx of MI, stents, Afib, HTN. Pt states he took his BP meds today. Pt states unable to take aspirin due to causing GI bleed. Pt does take warfarin. Pt placed in position of comfort with bed locked and lowered, call bell in reach.

## 2019-05-05 NOTE — H&P (Addendum)
Date: 05/05/2019               Patient Name:  ROBERTJOHN BOLANOS MRN: [id number]  DOB: 02/28/1941 Age / Sex: 78 y.o., male   PCP: Raina Mina., MD         Medical Service: Internal Medicine Teaching Service         Attending Physician: Dr. Bartholomew Crews, MD    First Contact: Dr. Charleen Kirks Pager: [phone]  Second Contact: Dr. Tarri Abernethy Pager: [phone]       After Hours (After 5p/  First Contact Pager: [phone]  weekends / holidays): Second Contact Pager: [phone]   Chief Complaint: chest pain  History of Present Illness: Mr. Cleto is a 78 yo M with a PMHx significant for multivessel CAD (MI s/p stenting), afib on warfarin, DM and HTN who presented to the ED from home with the chief complaint of sudden onset, non radiating, left sided chest pain which began when he was working in the yard today. Described as constant, stabbing pain. Pt took 3 nitro prior to EMS arrival with little relief. EMS gave patient 2 additional nitro with complete resolution of his pain. Pain did not radiate into his jaw or down his arm. Pain felt similar to previous MI, but not as severe. Patient denies dyspnea, nausea, back pain, fevers, chills, sick contacts, cough, abdominal pain. He does endorse chronic diarrhea for the last 2 months which is being worked up by PCP. He had a recent colonoscopy as patient had blood in his stool which they determined was from taking ASA in addition to his warfarin. He has no other complaints or concerns.   Meds:  Current Meds  Medication Sig  . carvedilol (COREG) 25 MG tablet TAKE 1 TABLET BY MOUTH TWICE DAILY WITH A MEAL (Patient taking differently: Take 25 mg by mouth 2 (two) times daily with a meal. )  . Coenzyme Q10 (COQ10) 100 MG CAPS Take 100 mg by mouth daily.  Marland Kitchen diltiazem (CARDIZEM CD) 300 MG 24 hr capsule Take 300 mg by mouth daily.   . furosemide (LASIX) 20 MG tablet Take 1 tablet by mouth every other day.  Marland Kitchen glipiZIDE (GLUCOTROL XL) 5 MG 24 hr tablet Take 5 mg by  mouth daily.   . Insulin Glargine (LANTUS SOLOSTAR) 100 UNIT/ML Solostar Pen Inject 10 Units into the skin at bedtime.   . isosorbide mononitrate (IMDUR) 30 MG 24 hr tablet Take 0.5 tablets (15 mg total) by mouth daily. (Patient taking differently: Take 15 mg by mouth at bedtime. )  . nitroGLYCERIN (NITROSTAT) 0.4 MG SL tablet Place 1 tablet (0.4 mg total) under the tongue every 5 (five) minutes as needed for chest pain.  Marland Kitchen sertraline (ZOLOFT) 25 MG tablet Take 25 mg by mouth daily.   . tamsulosin (FLOMAX) 0.4 MG CAPS capsule Take 0.4 mg by mouth at bedtime.   . traMADol-acetaminophen (ULTRACET) 37.5-325 MG per tablet Take 1 tablet by mouth 2 (two) times daily.   Marland Kitchen warfarin (COUMADIN) 5 MG tablet Take 5 mg by mouth daily.    Allergies: Allergies as of 05/05/2019 - Review Complete 05/05/2019  Allergen Reaction Noted  . Lisinopril Shortness Of Breath 01/03/2018  . Ticagrelor Shortness Of Breath and Other (See Comments) 03/13/2015  . Cephalexin Diarrhea and Nausea And Vomiting 09/03/2016  . Prednisone Other (See Comments) 03/13/2015  . Simvastatin Other (See Comments) 01/13/2016   Past Medical History:  Diagnosis Date  . AAA (abdominal aortic aneurysm) ([place])  small. An abdominal ultrasound in 2011 showed no aneurysm  . ACS (acute coronary syndrome) ([place])   . Arthritis   . Atrial fibrillation ([place])    permanent  . Coronary artery disease   . Diabetes mellitus without complication ([place])   . Hyperlipidemia   . Hypertension   . Mitral regurgitation    with MVP. Mild to moderate  . Myocardial infarction ([place])   . Renal artery stenosis ([place])   . Renal artery stenosis in 1 of 2 vessels ([place]) 2006   subtotal occlusion of right renal artery. Treated medically.   . Sexual dysfunction    Family History:  Family History  Problem Relation Age of Onset  . Heart disease Sister    Social History:  Lives at home with grandson and his cat Doesn't smoke, drink or use drugs   Review of  Systems: A complete ROS was negative except as per HPI.   Physical Exam: Blood pressure (!) 176/115, pulse 70, temperature 98.3 F (36.8 C), temperature source Oral, resp. rate 18, weight 87.1 kg, SpO2 100 %.  .Physical Exam  Constitutional: He is oriented to person, place, and time and well-developed, well-nourished, and in no distress. No distress.  HENT:  Head: Normocephalic and atraumatic.  Eyes: Pupils are equal, round, and reactive to light.  Neck: Normal range of motion.  Cardiovascular: Normal rate.  No murmur heard. Irregularly irregular rhythm   Pulmonary/Chest: Effort normal and breath sounds normal. No respiratory distress.  Abdominal: Soft. Bowel sounds are normal. He exhibits no distension.  Musculoskeletal: Normal range of motion.        General: No deformity.  Neurological: He is alert and oriented to person, place, and time.  Skin: Skin is warm and dry. No rash noted. He is not diaphoretic. No erythema.  Psychiatric: Affect normal.  Nursing note and vitals reviewed.  Labs: Results for orders placed or performed during the hospital encounter of 05/05/19 (from the past 24 hour(s))  Basic metabolic panel     Status: Abnormal   Collection Time: 05/05/19  6:25 PM  Result Value Ref Range   Sodium 141 135 - 145 mmol/L   Potassium 4.0 3.5 - 5.1 mmol/L   Chloride 108 98 - 111 mmol/L   CO2 22 22 - 32 mmol/L   Glucose, Bld 164 (H) 70 - 99 mg/dL   BUN 25 (H) 8 - 23 mg/dL   Creatinine, Ser 1.40 (H) 0.61 - 1.24 mg/dL   Calcium 9.8 8.9 - 10.3 mg/dL   GFR calc non Af Amer 48 (L) >60 mL/min   GFR calc Af Amer 56 (L) >60 mL/min   Anion gap 11 5 - 15  CBC     Status: Abnormal   Collection Time: 05/05/19  6:25 PM  Result Value Ref Range   WBC 6.4 4.0 - 10.5 K/uL   RBC 4.96 4.22 - 5.81 MIL/uL   Hemoglobin 14.7 13.0 - 17.0 g/dL   HCT 44.5 39.0 - 52.0 %   MCV 89.7 80.0 - 100.0 fL   MCH 29.6 26.0 - 34.0 pg   MCHC 33.0 30.0 - 36.0 g/dL   RDW 13.8 11.5 - 15.5 %   Platelets  148 (L) 150 - 400 K/uL   nRBC 0.0 0.0 - 0.2 %  Troponin I (High Sensitivity)     Status: Abnormal   Collection Time: 05/05/19  6:25 PM  Result Value Ref Range   Troponin I (High Sensitivity) 32 (H) <18 ng/L  Protime-INR (order if Patient  is taking Coumadin / Warfarin)     Status: Abnormal   Collection Time: 05/05/19  6:25 PM  Result Value Ref Range   Prothrombin Time 20.9 (H) 11.4 - 15.2 seconds   INR 1.8 (H) 0.8 - 1.2   EKG: personally reviewed my interpretation is atrial fibrillation.  CXR: personally reviewed my interpretation is no active cardiopulmonary disease.   Assessment:  Mr. Iwanski is a 78 yo M with a PMHx significant for multivessel CAD (MI s/p stenting), afib on warfarin, DM and HTN who presented to the ED from home with the chief complaint of sudden onset, non radiating, left sided chest pain which began when he was working in the yard today who had an elevated high sensitivity troponin on admission without st elevation on EKG whose presentation is concerning for NSTEMI.  Plan by Problem:  Active Problems:   Chest pain  Chest Pain -presented to the ED from home with the chief complaint of sudden onset, non radiating, left sided chest pain which began when he was working in the yard  -pain initially unrelieved by 3 of nitro but completely relieved after an additional 2 -EKG without st elevations or other new changes -high sensitivity troponin mildly elevated at 32 -given that patient's story is consistent with cardiac chest pain and he has a history of CAD, his presentation is concerning for ACS/NSTEMI; while  this could be a costochondritis from doing yardbook, his pain is not exacerbated by movement and ACS more likely; unlikely an infectious etiology as patient has no fever, chills, cough, upper respiratory sx or sick contacts; no syncope or back pain so aortic dissection unlikely; pain was constant and did not come in spasms so esophageal spasms unlikely and no emesis or  air on chest x-ray as one would see with a mallory weiss tear or boerhaave's, respectively; patient on warfarin and without SOB so PE unlikely as well. -will trend troponins and get EKG prn with pain -second trop showed a rise to 60 so cardiology consulted and recommended starting heparin  -talked to pharmacy and patient's warfarin being held and heparin started  -fu trops  CAD/CHF: -continue home meds: imdur 7.5 mg qd, 20 mg lasix qod  Afib/HTN: -continue warfarin 5 mg qd, coreg 25 mg qd and diltiazem 300 mg qd  Chronic Conditions: -continue flomax .4 mg qd -continue tramadol/acetaminophen prn for arthritic pain -continue zoloft 25 mg qd  Dispo: Admit patient to Observation with expected length of stay less than 2 midnights.  Signed: Al Decant, MD 05/05/2019, 9:16 PM  Pager: [phone]

## 2019-05-05 NOTE — ED Notes (Signed)
Patient transported to X-ray 

## 2019-05-06 ENCOUNTER — Encounter (HOSPITAL_COMMUNITY): Payer: Self-pay

## 2019-05-06 DIAGNOSIS — Z7901 Long term (current) use of anticoagulants: Secondary | ICD-10-CM

## 2019-05-06 DIAGNOSIS — Z79891 Long term (current) use of opiate analgesic: Secondary | ICD-10-CM

## 2019-05-06 DIAGNOSIS — Z955 Presence of coronary angioplasty implant and graft: Secondary | ICD-10-CM

## 2019-05-06 DIAGNOSIS — R7989 Other specified abnormal findings of blood chemistry: Secondary | ICD-10-CM

## 2019-05-06 DIAGNOSIS — Z888 Allergy status to other drugs, medicaments and biological substances status: Secondary | ICD-10-CM

## 2019-05-06 DIAGNOSIS — I130 Hypertensive heart and chronic kidney disease with heart failure and stage 1 through stage 4 chronic kidney disease, or unspecified chronic kidney disease: Secondary | ICD-10-CM | POA: Diagnosis present

## 2019-05-06 DIAGNOSIS — I110 Hypertensive heart disease with heart failure: Secondary | ICD-10-CM

## 2019-05-06 DIAGNOSIS — I214 Non-ST elevation (NSTEMI) myocardial infarction: Secondary | ICD-10-CM | POA: Diagnosis not present

## 2019-05-06 DIAGNOSIS — Z794 Long term (current) use of insulin: Secondary | ICD-10-CM

## 2019-05-06 DIAGNOSIS — Z881 Allergy status to other antibiotic agents status: Secondary | ICD-10-CM | POA: Diagnosis not present

## 2019-05-06 DIAGNOSIS — K529 Noninfective gastroenteritis and colitis, unspecified: Secondary | ICD-10-CM | POA: Diagnosis present

## 2019-05-06 DIAGNOSIS — R079 Chest pain, unspecified: Secondary | ICD-10-CM

## 2019-05-06 DIAGNOSIS — I200 Unstable angina: Secondary | ICD-10-CM

## 2019-05-06 DIAGNOSIS — I341 Nonrheumatic mitral (valve) prolapse: Secondary | ICD-10-CM | POA: Diagnosis present

## 2019-05-06 DIAGNOSIS — I5022 Chronic systolic (congestive) heart failure: Secondary | ICD-10-CM | POA: Diagnosis present

## 2019-05-06 DIAGNOSIS — I252 Old myocardial infarction: Secondary | ICD-10-CM | POA: Diagnosis not present

## 2019-05-06 DIAGNOSIS — N179 Acute kidney failure, unspecified: Secondary | ICD-10-CM | POA: Diagnosis present

## 2019-05-06 DIAGNOSIS — E119 Type 2 diabetes mellitus without complications: Secondary | ICD-10-CM

## 2019-05-06 DIAGNOSIS — I340 Nonrheumatic mitral (valve) insufficiency: Secondary | ICD-10-CM | POA: Diagnosis present

## 2019-05-06 DIAGNOSIS — Z8249 Family history of ischemic heart disease and other diseases of the circulatory system: Secondary | ICD-10-CM | POA: Diagnosis not present

## 2019-05-06 DIAGNOSIS — I2510 Atherosclerotic heart disease of native coronary artery without angina pectoris: Secondary | ICD-10-CM

## 2019-05-06 DIAGNOSIS — Z20828 Contact with and (suspected) exposure to other viral communicable diseases: Secondary | ICD-10-CM | POA: Diagnosis present

## 2019-05-06 DIAGNOSIS — Z79899 Other long term (current) drug therapy: Secondary | ICD-10-CM

## 2019-05-06 DIAGNOSIS — I351 Nonrheumatic aortic (valve) insufficiency: Secondary | ICD-10-CM | POA: Diagnosis not present

## 2019-05-06 DIAGNOSIS — J449 Chronic obstructive pulmonary disease, unspecified: Secondary | ICD-10-CM | POA: Diagnosis present

## 2019-05-06 DIAGNOSIS — I701 Atherosclerosis of renal artery: Secondary | ICD-10-CM | POA: Diagnosis present

## 2019-05-06 DIAGNOSIS — R791 Abnormal coagulation profile: Secondary | ICD-10-CM | POA: Diagnosis present

## 2019-05-06 DIAGNOSIS — I25110 Atherosclerotic heart disease of native coronary artery with unstable angina pectoris: Secondary | ICD-10-CM | POA: Diagnosis present

## 2019-05-06 DIAGNOSIS — N183 Chronic kidney disease, stage 3 (moderate): Secondary | ICD-10-CM | POA: Diagnosis present

## 2019-05-06 DIAGNOSIS — M1990 Unspecified osteoarthritis, unspecified site: Secondary | ICD-10-CM | POA: Diagnosis present

## 2019-05-06 DIAGNOSIS — E785 Hyperlipidemia, unspecified: Secondary | ICD-10-CM | POA: Diagnosis present

## 2019-05-06 DIAGNOSIS — I2582 Chronic total occlusion of coronary artery: Secondary | ICD-10-CM | POA: Diagnosis present

## 2019-05-06 DIAGNOSIS — I4821 Permanent atrial fibrillation: Secondary | ICD-10-CM | POA: Diagnosis present

## 2019-05-06 DIAGNOSIS — I249 Acute ischemic heart disease, unspecified: Secondary | ICD-10-CM | POA: Diagnosis present

## 2019-05-06 DIAGNOSIS — Z87891 Personal history of nicotine dependence: Secondary | ICD-10-CM | POA: Diagnosis not present

## 2019-05-06 DIAGNOSIS — E1122 Type 2 diabetes mellitus with diabetic chronic kidney disease: Secondary | ICD-10-CM | POA: Diagnosis present

## 2019-05-06 DIAGNOSIS — I4891 Unspecified atrial fibrillation: Secondary | ICD-10-CM

## 2019-05-06 LAB — LIPID PANEL
Cholesterol: 206 mg/dL — ABNORMAL HIGH (ref 0–200)
HDL: 29 mg/dL — ABNORMAL LOW (ref >40)
LDL Cholesterol: 159 mg/dL — ABNORMAL HIGH (ref 0–99)
Total CHOL/HDL Ratio: 7.1 RATIO
Triglycerides: 92 mg/dL (ref <150)
VLDL: 18 mg/dL (ref 0–40)

## 2019-05-06 LAB — BASIC METABOLIC PANEL
Anion gap: 13 (ref 5–15)
BUN: 23 mg/dL (ref 8–23)
CO2: 21 mmol/L — ABNORMAL LOW (ref 22–32)
Calcium: 9.7 mg/dL (ref 8.9–10.3)
Chloride: 109 mmol/L (ref 98–111)
Creatinine, Ser: 1.51 mg/dL — ABNORMAL HIGH (ref 0.61–1.24)
GFR calc Af Amer: 51 mL/min — ABNORMAL LOW (ref >60)
GFR calc non Af Amer: 44 mL/min — ABNORMAL LOW (ref >60)
Glucose, Bld: 106 mg/dL — ABNORMAL HIGH (ref 70–99)
Potassium: 3.9 mmol/L (ref 3.5–5.1)
Sodium: 143 mmol/L (ref 135–145)

## 2019-05-06 LAB — CBC
HCT: 42 % (ref 39.0–52.0)
Hemoglobin: 14.3 g/dL (ref 13.0–17.0)
MCH: 29.7 pg (ref 26.0–34.0)
MCHC: 34 g/dL (ref 30.0–36.0)
MCV: 87.1 fL (ref 80.0–100.0)
Platelets: 123 10*3/uL — ABNORMAL LOW (ref 150–400)
RBC: 4.82 MIL/uL (ref 4.22–5.81)
RDW: 13.8 % (ref 11.5–15.5)
WBC: 6.1 10*3/uL (ref 4.0–10.5)
nRBC: 0 % (ref 0.0–0.2)

## 2019-05-06 LAB — GLUCOSE, CAPILLARY
Glucose-Capillary: 129 mg/dL — ABNORMAL HIGH (ref 70–99)
Glucose-Capillary: 126 mg/dL — ABNORMAL HIGH (ref 70–99)
Glucose-Capillary: 122 mg/dL — ABNORMAL HIGH (ref 70–99)
Glucose-Capillary: 169 mg/dL — ABNORMAL HIGH (ref 70–99)

## 2019-05-06 LAB — TROPONIN I (HIGH SENSITIVITY)
Troponin I (High Sensitivity): 91 ng/L — ABNORMAL HIGH (ref <18)
Troponin I (High Sensitivity): 97 ng/L — ABNORMAL HIGH (ref <18)
Troponin I (High Sensitivity): 80 ng/L — ABNORMAL HIGH (ref <18)

## 2019-05-06 LAB — PROTIME-INR
INR: 2 — ABNORMAL HIGH (ref 0.8–1.2)
Prothrombin Time: 22.6 seconds — ABNORMAL HIGH (ref 11.4–15.2)

## 2019-05-06 LAB — HEPARIN LEVEL (UNFRACTIONATED): Heparin Unfractionated: 0.4 IU/mL (ref 0.30–0.70)

## 2019-05-06 LAB — MAGNESIUM: Magnesium: 1.8 mg/dL (ref 1.7–2.4)

## 2019-05-06 MED ORDER — ASPIRIN 81 MG PO CHEW
81.0000 mg | CHEWABLE_TABLET | Freq: Every day | ORAL | Status: DC
  Administered 2019-05-06 – 2019-05-07 (×2): 81 mg via ORAL
  Filled 2019-05-06 (×2): qty 1

## 2019-05-06 MED ORDER — HEPARIN (PORCINE) 25000 UT/250ML-% IV SOLN
1200.0000 [IU]/h | INTRAVENOUS | Status: DC
  Administered 2019-05-06 – 2019-05-07 (×3): 1200 [IU]/h via INTRAVENOUS
  Filled 2019-05-06 (×3): qty 250

## 2019-05-06 MED ORDER — ATORVASTATIN CALCIUM 80 MG PO TABS
80.0000 mg | ORAL_TABLET | Freq: Every day | ORAL | Status: DC
  Administered 2019-05-06: 80 mg via ORAL
  Filled 2019-05-06: qty 1

## 2019-05-06 NOTE — Consult Note (Signed)
CARDIOLOGY CONSULT NOTE  Patient ID: Marc [name] MRN: [id number] DOB/AGE: [birth year] 78 y.o.  Admit date: 05/05/2019 Primary Physician: Dr Bea Graff Primary Cardiologist: Dr. Geraldo Pitter Chief Complaint: Chest Pain Requesting: Internal Medicine   HPI:   Marc [name] is a 78 yo M with multivessel CAD (last LHC in 01/2018), AF (on warfarin), DMII, and HTN who presented to the ED from home with the chief complaint of chest pain. Cardiology is consulted for evaluation of elevated troponin (32-> 60 -> 91).  Patient states his pain first began when he was raking dirt in his yard today. He developed sudden onset, stabbing, left sided chest pain without radiation, that was about 6-7/10 in severity. He was able to make it inside of his house and proceeded to take 3 sublingual nitroglycerin with resolution of much of his pain.  However because his pain was still somewhat present he decided to call EMS.  With EMS he received 2 additional sublingual nitroglycerin tablets with complete resolution.  He has not had recurrence of his chest pain since then. Patient denies dyspnea, nausea/vomiting, lightheadedness/dizziness/headaches, back pain, fevers/chills, or abdominal pain.  He states that prior to his episode of chest pain, he has been doing quite well from a cardiac standpoint over the past several months.  In the ED, initial vital signs showed patient hypertensive (198/116).  Patient was satting 98% on room air.  EKG showed rate controlled AF with changes.  Troponins were drawn and revealed a trend of 32-> 60 -> 91.  INR is 1.8.  Hemoglobin A1c was 7.0%.  Hemoglobin is 11.5 from 12.41-year ago.  There was no leukocytosis and platelets are 137.    Past Medical History:  Diagnosis Date  . AAA (abdominal aortic aneurysm) (HCC)    small. An abdominal ultrasound in 2011 showed no aneurysm  . ACS (acute coronary syndrome) ([place])   . Arthritis   . Atrial fibrillation ([place])    permanent  . Coronary artery  disease   . Diabetes mellitus without complication ([place])   . Hyperlipidemia   . Hypertension   . Mitral regurgitation    with MVP. Mild to moderate  . Myocardial infarction ([place])   . Renal artery stenosis ([place])   . 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 Treated medically.   . Sexual dysfunction     Past Surgical History:  Procedure Laterality Date  . CARDIAC CATHETERIZATION  2006   showed an occluded Left circumflex with collaterals as well as 60% mid right coronary artery stenosis  . CARDIAC CATHETERIZATION  02/2011   LM: 20%, LAD: calcified 20%, LCX: occluded distal AV groove supplying a small area, RCA: calcified with 50-60% proximal-mid disease  . CIRCUMCISION    . FOOT SURGERY Right   . IR ANGIO INTRA EXTRACRAN SEL COM CAROTID INNOMINATE BILAT MOD SED  04/14/2018  . IR ANGIO VERTEBRAL SEL SUBCLAVIAN INNOMINATE UNI R MOD SED  04/14/2018  . IR ANGIO VERTEBRAL SEL VERTEBRAL UNI L MOD SED  04/14/2018  . LEFT HEART CATH AND CORONARY ANGIOGRAPHY N/A 01/04/2018   Procedure: LEFT HEART CATH AND CORONARY ANGIOGRAPHY;  Surgeon: Wellington Hampshire, MD;  Location: [facility];  Service: Cardiovascular;  Laterality: N/A;    Allergies  Allergen Reactions  . Lisinopril Shortness Of Breath       . Ticagrelor Shortness Of Breath and Other (See Comments)    * Brilinta* Pt short of breath  .  Cephalexin Diarrhea and Nausea And Vomiting       . Prednisone Other (See Comments)    GI Issues  . Simvastatin Other (See Comments)    Unsure   Medications Prior to Admission  Medication Sig Dispense Refill Last Dose  . carvedilol (COREG) 25 MG tablet TAKE 1 TABLET BY MOUTH TWICE DAILY WITH A MEAL (Patient taking differently: Take 25 mg by mouth 2 (two) times daily with a meal. ) 60 tablet 0 05/05/2019 at 1000  . Coenzyme Q10 (COQ10) 100 MG CAPS Take 100 mg by mouth daily.   05/05/2019 at Unknown time  . diltiazem (CARDIZEM CD) 300 MG 24 hr  capsule Take 300 mg by mouth daily.    05/05/2019 at Unknown time  . furosemide (LASIX) 20 MG tablet Take 1 tablet by mouth every other day.   05/04/2019 at Unknown time  . glipiZIDE (GLUCOTROL XL) 5 MG 24 hr tablet Take 5 mg by mouth daily.    05/05/2019 at Unknown time  . Insulin Glargine (LANTUS SOLOSTAR) 100 UNIT/ML Solostar Pen Inject 10 Units into the skin at bedtime.    05/04/2019 at Unknown time  . isosorbide mononitrate (IMDUR) 30 MG 24 hr tablet Take 0.5 tablets (15 mg total) by mouth daily. (Patient taking differently: Take 15 mg by mouth at bedtime. ) 30 tablet 0 05/04/2019 at Unknown time  . nitroGLYCERIN (NITROSTAT) 0.4 MG SL tablet Place 1 tablet (0.4 mg total) under the tongue every 5 (five) minutes as needed for chest pain. 25 tablet 2 05/05/2019 at Unknown time  . sertraline (ZOLOFT) 25 MG tablet Take 25 mg by mouth daily.    05/05/2019 at Unknown time  . tamsulosin (FLOMAX) 0.4 MG CAPS capsule Take 0.4 mg by mouth at bedtime.    05/04/2019 at Unknown time  . traMADol-acetaminophen (ULTRACET) 37.5-325 MG per tablet Take 1 tablet by mouth 2 (two) times daily.    05/05/2019 at Unknown time  . warfarin (COUMADIN) 5 MG tablet Take 5 mg by mouth daily.    05/05/2019 at 1000  . Insulin Pen Needle (NOVOFINE) 32G X 6 MM MISC USE AS DIRECTED.      Family History  Problem Relation Age of Onset  . Heart disease Sister     Social History   Socioeconomic History  . Marital status: Widowed    Spouse name: Not on file  . Number of children: Not on file  . Years of education: Not on file  . Highest education level: Not on file  Occupational History  . 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 Financial resource strain: Not on file  . Food insecurity    Worry: Not on file    Inability: Not on file  . Transportation needs    Medical: Not on file    Non-medical: Not on file  Tobacco Use  . Smoking status: Former Smoker    Packs/day: 0.10    Years: 30.00    Pack  years: 3.00    Types: Cigarettes    Quit date: 12/15/1983    Years since quitting: 35.4  . Smokeless tobacco: Former Systems developer    Types: [place] date: 09/07/1983  Substance and Sexual Activity  . Alcohol use: No  . Drug use: No  . Sexual activity: Not on file  Lifestyle  . Physical activity    Days per week: Not on file    Minutes per session: Not on file  .  Stress: Not on file  Relationships  . Social Herbalist on phone: Not on file    Gets together: Not on file    Attends religious service: Not on file    Active member of club or organization: Not on file    Attends meetings of clubs or organizations: Not on file    Relationship status: Not on file  . Intimate partner violence    Fear of current or ex partner: Not on file    Emotionally abused: Not on file    Physically abused: Not on file    Forced sexual activity: Not on file  Other Topics Concern  . Not on file  Social History Narrative  . Not on file     Review of Systems: [y] = yes, [ ]  = no       General: Weight gain [] ; Weight loss [ ] ; Anorexia [ ] ; Fatigue [ ] ; Fever [ ] ; Chills [ ] ; Weakness [ ]     Cardiac: Chest pain/pressure [ ] ; Resting SOB [ ] ; Exertional SOB [ ] ; Orthopnea [ ] ; Pedal Edema [ ] ; Palpitations [ ] ; Syncope [ ] ; Presyncope [ ] ; Paroxysmal nocturnal dyspnea[ ]     Pulmonary: Cough [ ] ; Wheezing[ ] ; Hemoptysis[ ] ; Sputum [ ] ; Snoring [ ]     GI: Vomiting[ ] ; Dysphagia[ ] ; Melena[ ] ; Hematochezia [ ] ; Heartburn[ ] ; Abdominal pain [ ] ; Constipation [ ] ; Diarrhea [ ] ; BRBPR [ ]     GU: Hematuria[ ] ; Dysuria [ ] ; Nocturia[ ]   Vascular: Pain in legs with walking [ ] ; Pain in feet with lying flat [ ] ; Non-healing sores [ ] ; Stroke [ ] ; TIA [ ] ; Slurred speech [ ] ;    Neuro: Headaches[ ] ; Vertigo[ ] ; Seizures[ ] ; Paresthesias[ ] ;Blurred vision [ ] ; Diplopia [ ] ; Vision changes [ ]     Ortho/Skin: Arthritis [ ] ; Joint pain [ ] ; Muscle pain [ ] ; Joint swelling [ ] ; Back Pain [ ] ; Rash [ ]      Psych: Depression[ ] ; Anxiety[ ]     Heme: Bleeding problems [ ] ; Clotting disorders [ ] ; Anemia [ ]     Endocrine: Diabetes [ ] ; Thyroid dysfunction[ ]   Physical Exam: Blood pressure (!) 149/80, pulse 79, temperature 98.3 F (36.8 C), temperature source Oral, resp. rate 18, weight 87.1 kg, SpO2 100 %.   GENERAL: Patient is afebrile, Vital signs reviewed, Well appearing, Patient appears comfortable, Alert and lucid. EYES: Normal inspection.Marland Kitchen CARD:  Irregularly irregular, not tachycardic, heart sounds normal. RESP:  no respiratory distress, breath sounds normal. ABD: soft, nontender to palpation nondistended, BS present.Marland Kitchen MUSC:  normal ROM, non-tender, no pedal edema . SKIN: color normal, no rash, warm, dry . NEURO: awake & alert, lucid, no motor/sensory deficit. PSYCH: mood/affect normal.  Labs: Lab Results  Component Value Date   BUN 25 (H) 05/05/2019   Lab Results  Component Value Date   CREATININE 1.40 (H) 05/05/2019   Lab Results  Component Value Date   NA 141 05/05/2019   K 4.0 05/05/2019   CL 108 05/05/2019   CO2 22 05/05/2019   Lab Results  Component Value Date   TROPONINI 8.18 ([place]) 01/04/2018   Lab Results  Component Value Date   WBC 6.4 05/05/2019   HGB 14.7 05/05/2019   HCT 44.5 05/05/2019   MCV 89.7 05/05/2019   PLT 148 (L) 05/05/2019   Lab Results  Component Value Date   CHOL 130 01/05/2018   HDL 34 (L) 01/05/2018  LDLCALC 84 01/05/2018   TRIG 61 01/05/2018   CHOLHDL 3.8 01/05/2018   No results found for: ALT, AST, GGT, ALKPHOS, BILITOT    Radiology:    CXR: IMPRESSION: No active cardiopulmonary disease.   LHC (01/2018):   Ost RCA to Prox RCA lesion is 50% stenosed.  Prox RCA to Mid RCA lesion is 40% stenosed.  Dist RCA lesion is 100% stenosed.  Ost LM lesion is 20% stenosed.  Ost Cx to Prox Cx lesion is 60% stenosed.  Prox LAD lesion is 30% stenosed.  There is mild left ventricular systolic dysfunction.  LV end diastolic  pressure is moderately elevated.  The left ventricular ejection fraction is 35-45% by visual estimate.   1.  Severe one-vessel coronary artery disease with thrombotic occlusion of the distal right coronary artery.  The RCA and LAD are both heavily calcified.  There is an ostial stent in the right coronary artery which appears to be underexpanded due to significant calcifications.  There is large organized thrombus in the distal right coronary artery with good left-to-right collaterals. 2.  Moderately reduced LV systolic function with an EF of 35 to 40% with inferior wall hypokinesis.  Mildly elevated left ventricular end-diastolic pressure.   TTE (09/2018): Overall LV systolic function is mild to moderately impaired with an EF between 40 to 45%.  The right ventricle is normal in size and function.  There is no evidence of aortic stenosis.  Mild to moderate MR is present, multi jet.  Mild tricuspid regurgitation present.   ASSESSMENT AND PLAN:   Marc [name] is a 78 yo M with multivessel CAD (last LHC in 01/2018), AF (on warfarin), DMII, and HTN who presented to the ED from home with the chief complaint of chest pain. Cardiology is consulted for evaluation of NSTEMI (hs-TnT 32-> 60 -> 91).  On last LHC over a year ago it showed severe one-vessel coronary artery disease with thrombotic occlusion of the distal right coronary artery that was medically managed due to concern for distal embolization.  However he did have other coronary disease it was deemed nonobstructive at the time, specifically in the circumflex and LAD distribution.  Given his myriad risk factors and heart score of at least 7, will opt to treat aggressively.  Will discuss with cardiology team and primary cardiologist whether repeat coronary evaluation is warranted during this admission.  -Start heparin gtt per ACS protocol  -Can hold warfarin while patient is on heparin -Continue patient's GDMT for CAD and heart failure -Continue to  watch on telemetry and monitor for signs of recurrent angina or hemodynamic compromise -Be sure to monitor I's and O's closely    Signed: Clois Dupes 05/06/2019, 4:02 AM

## 2019-05-06 NOTE — Progress Notes (Addendum)
Notified MD on phone pt has trop ponin at this time 60, asymtomatic, refused any chest pain, ,HR 79 stated will see ekg result, continue monitor, pt rest in bed

## 2019-05-06 NOTE — Progress Notes (Signed)
Patient had 14 beats of v-tach, he was asymptomatic, in bed resting, MD notified. Will continue to monitor.

## 2019-05-06 NOTE — Progress Notes (Deleted)
ANTICOAGULATION CONSULT NOTE - Initial Consult  Pharmacy Consult for Heparin Indication: chest pain/ACS  Allergies  Allergen Reactions  . Lisinopril Shortness Of Breath       . Ticagrelor Shortness Of Breath and Other (See Comments)    * Brilinta* Pt short of breath  . Cephalexin Diarrhea and Nausea And Vomiting       . Prednisone Other (See Comments)    GI Issues  . Simvastatin Other (See Comments)    Unsure    Patient Measurements: Height: 6' (182.9 cm) Weight: 187 lb 14.4 oz (85.2 kg) IBW/kg (Calculated) : 77.6  Vital Signs: Temp: 97.7 F (36.5 C) (08/30 1123) Temp Source: Oral (08/30 1123) BP: 157/110 (08/30 1123) Pulse Rate: 83 (08/30 1123)  Labs: Recent Labs    05/05/19 1825  05/06/19 0213 05/06/19 0425 05/06/19 0637 05/06/19 1327  HGB 14.7  --   --   --  14.3  --   HCT 44.5  --   --   --  42.0  --   PLT 148*  --   --   --  123*  --   LABPROT 20.9*  --  22.6*  --   --   --   INR 1.8*  --  2.0*  --   --   --   HEPARINUNFRC  --   --   --   --   --  0.40  CREATININE 1.40*  --   --  1.51*  --   --   TROPONINIHS 32*   < > 91* 97* 80*  --    < > = values in this interval not displayed.    Estimated Creatinine Clearance: 45 mL/min (A) (by C-G formula based on SCr of 1.51 mg/dL (H)).   Medical History: Past Medical History:  Diagnosis Date  . AAA (abdominal aortic aneurysm) (HCC)    small. An abdominal ultrasound in 2011 showed no aneurysm  . ACS (acute coronary syndrome) ([place])   . Arthritis   . Atrial fibrillation ([place])    permanent  . Coronary artery disease   . Diabetes mellitus without complication ([place])   . Hyperlipidemia   . Hypertension   . Mitral regurgitation    with MVP. Mild to moderate  . Myocardial infarction ([place])   . Renal artery stenosis ([place])   . Renal artery stenosis in 1 of 2 vessels ([place]) 2006   subtotal occlusion of right renal artery. Treated medically.   . Sexual dysfunction     Medications:  Medications Prior to  Admission  Medication Sig Dispense Refill Last Dose  . carvedilol (COREG) 25 MG tablet TAKE 1 TABLET BY MOUTH TWICE DAILY WITH A MEAL (Patient taking differently: Take 25 mg by mouth 2 (two) times daily with a meal. ) 60 tablet 0 05/05/2019 at 1000  . Coenzyme Q10 (COQ10) 100 MG CAPS Take 100 mg by mouth daily.   05/05/2019 at Unknown time  . diltiazem (CARDIZEM CD) 300 MG 24 hr capsule Take 300 mg by mouth daily.    05/05/2019 at Unknown time  . furosemide (LASIX) 20 MG tablet Take 1 tablet by mouth every other day.   05/04/2019 at Unknown time  . glipiZIDE (GLUCOTROL XL) 5 MG 24 hr tablet Take 5 mg by mouth daily.    05/05/2019 at Unknown time  . Insulin Glargine (LANTUS SOLOSTAR) 100 UNIT/ML Solostar Pen Inject 10 Units into the skin at bedtime.    05/04/2019 at Unknown time  . isosorbide mononitrate (IMDUR)  30 MG 24 hr tablet Take 0.5 tablets (15 mg total) by mouth daily. (Patient taking differently: Take 15 mg by mouth at bedtime. ) 30 tablet 0 05/04/2019 at Unknown time  . nitroGLYCERIN (NITROSTAT) 0.4 MG SL tablet Place 1 tablet (0.4 mg total) under the tongue every 5 (five) minutes as needed for chest pain. 25 tablet 2 05/05/2019 at Unknown time  . sertraline (ZOLOFT) 25 MG tablet Take 25 mg by mouth daily.    05/05/2019 at Unknown time  . tamsulosin (FLOMAX) 0.4 MG CAPS capsule Take 0.4 mg by mouth at bedtime.    05/04/2019 at Unknown time  . traMADol-acetaminophen (ULTRACET) 37.5-325 MG per tablet Take 1 tablet by mouth 2 (two) times daily.    05/05/2019 at Unknown time  . warfarin (COUMADIN) 5 MG tablet Take 5 mg by mouth daily.    05/05/2019 at 1000  . Insulin Pen Needle (NOVOFINE) 32G X 6 MM MISC USE AS DIRECTED.       Assessment: 78 y.o. male with chest pain, h/o Afib on Warfarin PTA. INR was subtherapeutic on admission at 1.8. He received one dose of warfarin on 8/29 before pharmacy consulted to hold warfarin and dose heparin for possible ACS.  Heparin level therapeutic at 0.4 after starting  heparin at 1200 units/hr. Hgb wnl at 14.3, plts low but improved at 80 from 60 yesterday. No overt bleeding per patient or infusion issues noted.  PTA regimen: 5 mg every morning Last dose: 8/29  Goal of Therapy:  Heparin level 0.3-0.7 units/ml Monitor platelets by anticoagulation protocol: Yes   Plan:  Continue heparin 1200 units/hr Check confirmatory heparin level in 8 hours Daily HL and CBC Monitor for bleeding F/u heparin LOT and restart of warfarin for AFib  Richardine Service, PharmD PGY1 Pharmacy Resident Phone: [phone] 05/06/2019  2:21 PM  Please check [email or web address] for unit-specific pharmacy phone numbers.

## 2019-05-06 NOTE — Plan of Care (Signed)

## 2019-05-06 NOTE — Progress Notes (Signed)
Patient ambulated about 100 ft on room air with no distress.

## 2019-05-06 NOTE — Progress Notes (Signed)
Notified MD update about  Troponin 91

## 2019-05-06 NOTE — Progress Notes (Signed)
ANTICOAGULATION CONSULT NOTE - Initial Consult  Pharmacy Consult for Heparin Indication: chest pain/ACS  Allergies  Allergen Reactions  . Lisinopril Shortness Of Breath       . Ticagrelor Shortness Of Breath and Other (See Comments)    * Brilinta* Pt short of breath  . Cephalexin Diarrhea and Nausea And Vomiting       . Prednisone Other (See Comments)    GI Issues  . Simvastatin Other (See Comments)    Unsure    Patient Measurements: Height: 6' (182.9 cm) Weight: 187 lb 14.4 oz (85.2 kg) IBW/kg (Calculated) : 77.6  Vital Signs: Temp: 97.7 F (36.5 C) (08/30 1123) Temp Source: Oral (08/30 1123) BP: 157/110 (08/30 1123) Pulse Rate: 83 (08/30 1123)  Labs: Recent Labs    05/05/19 1825  05/06/19 0213 05/06/19 0425 05/06/19 0637 05/06/19 1327  HGB 14.7  --   --   --  14.3  --   HCT 44.5  --   --   --  42.0  --   PLT 148*  --   --   --  123*  --   LABPROT 20.9*  --  22.6*  --   --   --   INR 1.8*  --  2.0*  --   --   --   HEPARINUNFRC  --   --   --   --   --  0.40  CREATININE 1.40*  --   --  1.51*  --   --   TROPONINIHS 32*   < > 91* 97* 80*  --    < > = values in this interval not displayed.    Estimated Creatinine Clearance: 45 mL/min (A) (by C-G formula based on SCr of 1.51 mg/dL (H)).   Medical History: Past Medical History:  Diagnosis Date  . AAA (abdominal aortic aneurysm) (HCC)    small. An abdominal ultrasound in 2011 showed no aneurysm  . ACS (acute coronary syndrome) ([place])   . Arthritis   . Atrial fibrillation ([place])    permanent  . Coronary artery disease   . Diabetes mellitus without complication ([place])   . Hyperlipidemia   . Hypertension   . Mitral regurgitation    with MVP. Mild to moderate  . Myocardial infarction ([place])   . Renal artery stenosis ([place])   . Renal artery stenosis in 1 of 2 vessels ([place]) 2006   subtotal occlusion of right renal artery. Treated medically.   . Sexual dysfunction     Medications:  Medications Prior to  Admission  Medication Sig Dispense Refill Last Dose  . carvedilol (COREG) 25 MG tablet TAKE 1 TABLET BY MOUTH TWICE DAILY WITH A MEAL (Patient taking differently: Take 25 mg by mouth 2 (two) times daily with a meal. ) 60 tablet 0 05/05/2019 at 1000  . Coenzyme Q10 (COQ10) 100 MG CAPS Take 100 mg by mouth daily.   05/05/2019 at Unknown time  . diltiazem (CARDIZEM CD) 300 MG 24 hr capsule Take 300 mg by mouth daily.    05/05/2019 at Unknown time  . furosemide (LASIX) 20 MG tablet Take 1 tablet by mouth every other day.   05/04/2019 at Unknown time  . glipiZIDE (GLUCOTROL XL) 5 MG 24 hr tablet Take 5 mg by mouth daily.    05/05/2019 at Unknown time  . Insulin Glargine (LANTUS SOLOSTAR) 100 UNIT/ML Solostar Pen Inject 10 Units into the skin at bedtime.    05/04/2019 at Unknown time  . isosorbide mononitrate (IMDUR)  30 MG 24 hr tablet Take 0.5 tablets (15 mg total) by mouth daily. (Patient taking differently: Take 15 mg by mouth at bedtime. ) 30 tablet 0 05/04/2019 at Unknown time  . nitroGLYCERIN (NITROSTAT) 0.4 MG SL tablet Place 1 tablet (0.4 mg total) under the tongue every 5 (five) minutes as needed for chest pain. 25 tablet 2 05/05/2019 at Unknown time  . sertraline (ZOLOFT) 25 MG tablet Take 25 mg by mouth daily.    05/05/2019 at Unknown time  . tamsulosin (FLOMAX) 0.4 MG CAPS capsule Take 0.4 mg by mouth at bedtime.    05/04/2019 at Unknown time  . traMADol-acetaminophen (ULTRACET) 37.5-325 MG per tablet Take 1 tablet by mouth 2 (two) times daily.    05/05/2019 at Unknown time  . warfarin (COUMADIN) 5 MG tablet Take 5 mg by mouth daily.    05/05/2019 at 1000  . Insulin Pen Needle (NOVOFINE) 32G X 6 MM MISC USE AS DIRECTED.       Assessment: 78 y.o. male with chest pain, h/o Afib on Warfarin PTA. INR was subtherapeutic on admission at 1.8. He received one dose of warfarin on 8/29 before pharmacy consulted to hold warfarin and dose heparin for possible ACS. Of note, patient states his last dose PTA was 8/29  AM, but he also received a dose in PM of 8/29 once admitted.  Heparin level therapeutic at 0.4 after starting heparin at 1200 units/hr. Hgb wnl at 14.3, plts low but improved at 80 from 60 yesterday. No overt bleeding per patient or infusion issues noted.  PTA regimen: 5 mg every morning Last dose: 8/29  Goal of Therapy:  Heparin level 0.3-0.7 units/ml Monitor platelets by anticoagulation protocol: Yes   Plan:  Continue heparin 1200 units/hr Check confirmatory heparin level in 8 hours Daily HL and CBC Monitor for bleeding F/u heparin LOT and restart of warfarin for AFib  Richardine Service, PharmD PGY1 Pharmacy Resident Phone: [phone] 05/06/2019  2:27 PM  Please check [email or web address] for unit-specific pharmacy phone numbers.

## 2019-05-06 NOTE — Progress Notes (Addendum)
Subjective:   Marc [name] is doing okay this AM. He is hungry and thirsty. He is wondering when he can go home. He is no longer having chest pain or SHOB. He was moving some dirt in the yard yesterday when the CP started. He is not very active at baseline and does not exercise daily. He is able to walk around his house without discomfort.  We discussed coordinating care with cardiology. Depending on the need for further ischemic evaluation, will determine when he can go home. We also discussed his DM and possibly switching some medications that would protect his heart. He will think about it.   Objective: Vital signs in last 24 hours: Vitals:   05/05/19 2115 05/05/19 2210 05/06/19 0215 05/06/19 0522  BP: (!) 180/71 (!) 176/115 (!) 149/80 (!) 152/97  Pulse: 61 70 79 96  Resp: 16 18  18   Temp:  98.3 F (36.8 C)  98.6 F (37 C)  TempSrc:  Oral  Oral  SpO2: 99% 100%  97%  Weight:  85.3 kg  85.2 kg  Height:  6' (1.829 m)     Physical Exam Constitutional:      General: He is not in acute distress.    Appearance: He is normal weight. He is not toxic-appearing.  Cardiovascular:     Rate and Rhythm: Normal rate. Rhythm irregular.     Heart sounds: No systolic murmur. No diastolic murmur.  Pulmonary:     Effort: Pulmonary effort is normal. No respiratory distress.     Breath sounds: Normal breath sounds. No decreased breath sounds, wheezing, rhonchi or rales.  Skin:    General: Skin is warm and dry.     Findings: Erythema present.  Neurological:     General: No focal deficit present.     Mental Status: He is alert and oriented to person, place, and time.    Assessment/Plan:  Active Problems:   Chest pain  # Chest Pain: # CAD s/p multivessel stenting   # Hypertension Given significant cardiac past medical history requiring multiple stents, patient is being considered for additional ischemic work up by cardiology.  Cards concerned about slightly bumped up creatinine today of 1.5,  and would like this to improve prior to considering angiography.  Will repeat BMP tomorrow morning and follow-up with their recommendations at that time.  - Cardiology is on board and we appreciate their recommendations. - Continue Coreg, Diltiazem  # Hyperlipidemia:  In regards to taking a statin, patient reports developing bony pain and malaise in the past.  However, his LDL increased significantly in the past year from 84 to 159.  Discussed with patient's daughter importance of restarting a statin, she will try talking to him and we will reassess his standpoint on this tomorrow morning.   - Re-assess willingness to restart a statin  # HFrEF:  He takes Imdur and Lasix at home. His last echo was in 09/2018 with a Ef of 40-45% with moderate LV hypokinesis. This is improved from his prior   - Continue home Imdur and Lasix  # A. Fib Per daughter, patient was unable to tolerate Xarelto in the past and he prefers Warfarin.   - Warfarin per pharmacy  # Diabetes Mellitus:  Home medication includes Glipizide 5mg  and Lantus 10 units at bedtime. This has been his regimen for a long time now. He denies discussing with his PCP other diabetic medications that would also benefit his other chronic medical conditions. He would be a good  candidate for GLP1 vs SGLT2 agents given their cardiac and renal protection. Patient stated he would think about it. Overall, DM well controlled with A1c of 7.0%.   - SSI while admitted.  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 May explain his creatinine bump. Unknown baseline creatinine but at an outpatient follow up in 2019, his creatinine was 1.3.     Dispo: Anticipated discharge pending further medical evaluation.   Dr. Jose Persia Internal Medicine PGY-1  Pager: [phone] 05/06/2019, 10:12 AM

## 2019-05-06 NOTE — Progress Notes (Signed)
Admit pt in room, pt is stable, elevate b/p meds given, refused any pain, oriented to room , equipment and policy, disscussed about care plan, verbalized understanding, rest in bed

## 2019-05-06 NOTE — Progress Notes (Signed)
ANTICOAGULATION CONSULT NOTE - Initial Consult  Pharmacy Consult for Heparin Indication: chest pain/ACS  Allergies  Allergen Reactions  . Lisinopril Shortness Of Breath       . Ticagrelor Shortness Of Breath and Other (See Comments)    * Brilinta* Pt short of breath  . Cephalexin Diarrhea and Nausea And Vomiting       . Prednisone Other (See Comments)    GI Issues  . Simvastatin Other (See Comments)    Unsure    Patient Measurements: Weight: 192 lb (87.1 kg)  Vital Signs: Temp: 98.3 F (36.8 C) (08/29 2210) Temp Source: Oral (08/29 2210) BP: 176/115 (08/29 2210) Pulse Rate: 70 (08/29 2210)  Labs: Recent Labs    05/05/19 1825 05/05/19 2225  HGB 14.7  --   HCT 44.5  --   PLT 148*  --   LABPROT 20.9*  --   INR 1.8*  --   CREATININE 1.40*  --   TROPONINIHS 32* 60*    Estimated Creatinine Clearance: 48.5 mL/min (A) (by C-G formula based on SCr of 1.4 mg/dL (H)).   Medical History: Past Medical History:  Diagnosis Date  . AAA (abdominal aortic aneurysm) (HCC)    small. An abdominal ultrasound in 2011 showed no aneurysm  . ACS (acute coronary syndrome) ([place])   . Arthritis   . Atrial fibrillation ([place])    permanent  . Coronary artery disease   . Diabetes mellitus without complication ([place])   . Hyperlipidemia   . Hypertension   . Mitral regurgitation    with MVP. Mild to moderate  . Myocardial infarction ([place])   . Renal artery stenosis ([place])   . Renal artery stenosis in 1 of 2 vessels ([place]) 2006   subtotal occlusion of right renal artery. Treated medically.   . Sexual dysfunction     Medications:  Medications Prior to Admission  Medication Sig Dispense Refill Last Dose  . carvedilol (COREG) 25 MG tablet TAKE 1 TABLET BY MOUTH TWICE DAILY WITH A MEAL (Patient taking differently: Take 25 mg by mouth 2 (two) times daily with a meal. ) 60 tablet 0 05/05/2019 at 1000  . Coenzyme Q10 (COQ10) 100 MG CAPS Take 100 mg by mouth daily.   05/05/2019 at Unknown time   . diltiazem (CARDIZEM CD) 300 MG 24 hr capsule Take 300 mg by mouth daily.    05/05/2019 at Unknown time  . furosemide (LASIX) 20 MG tablet Take 1 tablet by mouth every other day.   05/04/2019 at Unknown time  . glipiZIDE (GLUCOTROL XL) 5 MG 24 hr tablet Take 5 mg by mouth daily.    05/05/2019 at Unknown time  . Insulin Glargine (LANTUS SOLOSTAR) 100 UNIT/ML Solostar Pen Inject 10 Units into the skin at bedtime.    05/04/2019 at Unknown time  . isosorbide mononitrate (IMDUR) 30 MG 24 hr tablet Take 0.5 tablets (15 mg total) by mouth daily. (Patient taking differently: Take 15 mg by mouth at bedtime. ) 30 tablet 0 05/04/2019 at Unknown time  . nitroGLYCERIN (NITROSTAT) 0.4 MG SL tablet Place 1 tablet (0.4 mg total) under the tongue every 5 (five) minutes as needed for chest pain. 25 tablet 2 05/05/2019 at Unknown time  . sertraline (ZOLOFT) 25 MG tablet Take 25 mg by mouth daily.    05/05/2019 at Unknown time  . tamsulosin (FLOMAX) 0.4 MG CAPS capsule Take 0.4 mg by mouth at bedtime.    05/04/2019 at Unknown time  . traMADol-acetaminophen (ULTRACET) 37.5-325 MG per tablet  Take 1 tablet by mouth 2 (two) times daily.    05/05/2019 at Unknown time  . warfarin (COUMADIN) 5 MG tablet Take 5 mg by mouth daily.    05/05/2019 at 1000  . Insulin Pen Needle (NOVOFINE) 32G X 6 MM MISC USE AS DIRECTED.       Assessment: 78 y.o. male with chest pain, h/o Afib, INR subtheraeputic and Coumadin on hold, for heparin  Goal of Therapy:  Heparin level 0.3-0.7 units/ml Monitor platelets by anticoagulation protocol: Yes   Plan:  Start heparin 1200 units/hr Check heparin level in 8 hours.   Amadi Yoshino, [place] 05/06/2019,1:44 AM

## 2019-05-06 NOTE — Progress Notes (Signed)
Patient had 9 beats of V-tach. MD notified. Patient is asymptomatic in bed. Will continue to monitor.

## 2019-05-06 NOTE — Progress Notes (Signed)
   Chart reviewed, specifically Dr. Everlene Other consult note from this morning - agree with recommendations. BP remains high and creatinine up to 1.5 today - will need to improve this before we could consider repeat angiography. Troponin peaked at 91, which is quite low - suggesting less likely ACS, rather may be hypertensive emergency. LDL poorly controlled at 159. Start atorvastatin 80 mg daily. Consider non-invasive evaluation. Not currently on aspirin - known CAD with elevated troponin, start 81 mg daily today (platelet 123 - down from 148, monitor and hold ASA if plt<50K).  Pixie Casino, MD, [facility], [place] Director of the Advanced Lipid Disorders &  Cardiovascular Risk Reduction Clinic Diplomate of the American Board of Clinical Lipidology Attending Cardiologist  Direct Dial: [phone]  Fax: [phone]  Website:  [email or web address]

## 2019-05-07 ENCOUNTER — Other Ambulatory Visit (HOSPITAL_COMMUNITY): Payer: Medicare Other

## 2019-05-07 DIAGNOSIS — I25118 Atherosclerotic heart disease of native coronary artery with other forms of angina pectoris: Secondary | ICD-10-CM

## 2019-05-07 DIAGNOSIS — I4821 Permanent atrial fibrillation: Secondary | ICD-10-CM

## 2019-05-07 DIAGNOSIS — I4811 Longstanding persistent atrial fibrillation: Secondary | ICD-10-CM

## 2019-05-07 DIAGNOSIS — I10 Essential (primary) hypertension: Secondary | ICD-10-CM

## 2019-05-07 DIAGNOSIS — E1122 Type 2 diabetes mellitus with diabetic chronic kidney disease: Secondary | ICD-10-CM

## 2019-05-07 DIAGNOSIS — I130 Hypertensive heart and chronic kidney disease with heart failure and stage 1 through stage 4 chronic kidney disease, or unspecified chronic kidney disease: Secondary | ICD-10-CM

## 2019-05-07 LAB — CBC
HCT: 42.5 % (ref 39.0–52.0)
Hemoglobin: 14 g/dL (ref 13.0–17.0)
MCH: 29.3 pg (ref 26.0–34.0)
MCHC: 32.9 g/dL (ref 30.0–36.0)
MCV: 88.9 fL (ref 80.0–100.0)
Platelets: 115 10*3/uL — ABNORMAL LOW (ref 150–400)
RBC: 4.78 MIL/uL (ref 4.22–5.81)
RDW: 13.9 % (ref 11.5–15.5)
WBC: 6.6 10*3/uL (ref 4.0–10.5)
nRBC: 0 % (ref 0.0–0.2)

## 2019-05-07 LAB — GLUCOSE, CAPILLARY
Glucose-Capillary: 123 mg/dL — ABNORMAL HIGH (ref 70–99)
Glucose-Capillary: 130 mg/dL — ABNORMAL HIGH (ref 70–99)
Glucose-Capillary: 105 mg/dL — ABNORMAL HIGH (ref 70–99)
Glucose-Capillary: 144 mg/dL — ABNORMAL HIGH (ref 70–99)

## 2019-05-07 LAB — BASIC METABOLIC PANEL
Anion gap: 9 (ref 5–15)
BUN: 27 mg/dL — ABNORMAL HIGH (ref 8–23)
CO2: 23 mmol/L (ref 22–32)
Calcium: 9.6 mg/dL (ref 8.9–10.3)
Chloride: 110 mmol/L (ref 98–111)
Creatinine, Ser: 1.64 mg/dL — ABNORMAL HIGH (ref 0.61–1.24)
GFR calc Af Amer: 46 mL/min — ABNORMAL LOW (ref >60)
GFR calc non Af Amer: 40 mL/min — ABNORMAL LOW (ref >60)
Glucose, Bld: 130 mg/dL — ABNORMAL HIGH (ref 70–99)
Potassium: 3.9 mmol/L (ref 3.5–5.1)
Sodium: 142 mmol/L (ref 135–145)

## 2019-05-07 LAB — TSH: TSH: 4.681 u[IU]/mL — ABNORMAL HIGH (ref 0.350–4.500)

## 2019-05-07 LAB — HEPARIN LEVEL (UNFRACTIONATED): Heparin Unfractionated: 0.34 IU/mL (ref 0.30–0.70)

## 2019-05-07 MED ORDER — ROSUVASTATIN CALCIUM 20 MG PO TABS
40.0000 mg | ORAL_TABLET | Freq: Every day | ORAL | Status: DC
  Administered 2019-05-07: 40 mg via ORAL
  Filled 2019-05-07: qty 2

## 2019-05-07 MED ORDER — SODIUM CHLORIDE 0.9 % IV SOLN
INTRAVENOUS | Status: DC
  Administered 2019-05-07 – 2019-05-08 (×2): via INTRAVENOUS

## 2019-05-07 MED ORDER — DILTIAZEM HCL ER COATED BEADS 180 MG PO CP24
180.0000 mg | ORAL_CAPSULE | Freq: Every day | ORAL | Status: DC
  Administered 2019-05-07 – 2019-05-08 (×2): 180 mg via ORAL
  Filled 2019-05-07 (×2): qty 1

## 2019-05-07 NOTE — Progress Notes (Signed)
[facility] for Heparin Indication: chest pain/ACS  Allergies  Allergen Reactions  . Lisinopril Shortness Of Breath       . Ticagrelor Shortness Of Breath and Other (See Comments)    * Brilinta* Pt short of breath  . Cephalexin Diarrhea and Nausea And Vomiting       . Prednisone Other (See Comments)    GI Issues  . Simvastatin Other (See Comments)    Unsure    Patient Measurements: Height: 6' (182.9 cm) Weight: 187 lb 4.8 oz (85 kg) IBW/kg (Calculated) : 77.6  Vital Signs: Temp: 98.5 F (36.9 C) (08/31 0500) Temp Source: Oral (08/31 0500) BP: 151/76 (08/31 0500) Pulse Rate: 62 (08/31 0500)  Labs: Recent Labs    05/05/19 1825  05/06/19 0213 05/06/19 0425 05/06/19 0637 05/06/19 1327 05/07/19 0544  HGB 14.7  --   --   --  14.3  --   --   HCT 44.5  --   --   --  42.0  --   --   PLT 148*  --   --   --  123*  --   --   LABPROT 20.9*  --  22.6*  --   --   --   --   INR 1.8*  --  2.0*  --   --   --   --   HEPARINUNFRC  --   --   --   --   --  0.40 0.34  CREATININE 1.40*  --   --  1.51*  --   --  1.64*  TROPONINIHS 32*   < > 91* 97* 80*  --   --    < > = values in this interval not displayed.    Estimated Creatinine Clearance: 41.4 mL/min (A) (by C-G formula based on SCr of 1.64 mg/dL (H)).   Medical History: Past Medical History:  Diagnosis Date  . AAA (abdominal aortic aneurysm) (HCC)    small. An abdominal ultrasound in 2011 showed no aneurysm  . ACS (acute coronary syndrome) ([place])   . Arthritis   . Atrial fibrillation ([place])    permanent  . Coronary artery disease   . Diabetes mellitus without complication ([place])   . Hyperlipidemia   . Hypertension   . Mitral regurgitation    with MVP. Mild to moderate  . Myocardial infarction ([place])   . Renal artery stenosis ([place])   . Renal artery stenosis in 1 of 2 vessels ([place]) 2006   subtotal occlusion of right renal artery. Treated medically.   . Sexual dysfunction      Assessment: 78 y.o. male with chest pain, h/o Afib on Warfarin PTA (patient was unable to tolerate Xarelto in the past and he prefers Warfarin). INR was subtherapeutic on admission at 1.8.  Pharmacy consulted to  dose heparin for possible ACS. Plans noted for possible non-invasive evaluation. -heparin level at goal  PTA warfarin regimen: 5 mg every morning Last dose: 8/29  Goal of Therapy:  Heparin level 0.3-0.7 units/ml Monitor platelets by anticoagulation protocol: Yes   Plan:  -No heparin changes needed -Daily CBC and heparin level  Hildred Laser, PharmD Clinical Pharmacist **Pharmacist phone directory can now be found on [email or web address] ([email or web address]).  Listed under [place].

## 2019-05-07 NOTE — Progress Notes (Signed)
Notified cardiology  Pt had episode tele pause 2.76 second, asymptomatic, Afib , rest in bed, continue monitor

## 2019-05-07 NOTE — Progress Notes (Addendum)
Progress Note  Patient Name: Marc [name] Date of Encounter: 05/08/2019 Primary Cardiologist: Jenean Lindau, MD   Subjective   No chest pain overnight.   Inpatient Medications    Scheduled Meds: . aspirin  81 mg Oral Daily  . carvedilol  25 mg Oral BID WC  . insulin aspart  0-5 Units Subcutaneous QHS  . insulin aspart  0-9 Units Subcutaneous TID WC  . isosorbide mononitrate  15 mg Oral QHS  . rosuvastatin  40 mg Oral q1800  . sertraline  25 mg Oral Daily  . tamsulosin  0.4 mg Oral QHS   Continuous Infusions: . heparin 1,200 Units/hr (05/07/19 0704)   PRN Meds: nitroGLYCERIN, traMADol-acetaminophen   Vital Signs    Vitals:   05/07/19 0500 05/07/19 0600 05/07/19 0759 05/07/19 0900  BP: (!) 151/76  (!) 141/81   Pulse: 62  (!) 42 92  Resp:   15   Temp: 98.5 F (36.9 C)  99 F (37.2 C)   TempSrc: Oral  Oral   SpO2: 99%  96%   Weight:  85 kg    Height:        Intake/Output Summary (Last 24 hours) at 05/07/2019 1110 Last data filed at 05/07/2019 0950 Gross per 24 hour  Intake 1704.15 ml  Output 875 ml  Net 829.15 ml   Last 3 Weights 05/07/2019 05/06/2019 05/05/2019  Weight (lbs) 187 lb 4.8 oz 187 lb 14.4 oz 188 lb 1.6 oz  Weight (kg) 84.959 kg 85.231 kg 85.322 kg      Telemetry    Afib, rate controlled. NSVT - Personally Reviewed  ECG    No new tracing today - Personally Reviewed  Physical Exam  Pleasant older WM GEN: No acute distress.   Neck: No JVD Cardiac: Irreg, Irreg, no murmurs, rubs, or gallops.  Respiratory: Clear to auscultation bilaterally. GI: Soft, nontender, non-distended  MS: No edema; No deformity. Neuro:  Nonfocal  Psych: Normal affect  Extremities: Right radial puncture site stable  Labs    High Sensitivity Troponin:   Recent Labs  Lab 05/05/19 1825 05/05/19 2225 05/06/19 0213 05/06/19 0425 05/06/19 0637  TROPONINIHS 32* 60* 91* 97* 80*      Chemistry Recent Labs  Lab 05/05/19 1825 05/06/19 0425 05/07/19 0544   NA 141 143 142  K 4.0 3.9 3.9  CL 108 109 110  CO2 22 21* 23  GLUCOSE 164* 106* 130*  BUN 25* 23 27*  CREATININE 1.40* 1.51* 1.64*  CALCIUM 9.8 9.7 9.6  GFRNONAA 48* 44* 40*  GFRAA 56* 51* 46*  ANIONGAP 11 13 9      Hematology Recent Labs  Lab 05/05/19 1825 05/06/19 0637 05/07/19 0544  WBC 6.4 6.1 6.6  RBC 4.96 4.82 4.78  HGB 14.7 14.3 14.0  HCT 44.5 42.0 42.5  MCV 89.7 87.1 88.9  MCH 29.6 29.7 29.3  MCHC 33.0 34.0 32.9  RDW 13.8 13.8 13.9  PLT 148* 123* 115*    BNPNo results for input(s): BNP, PROBNP in the last 168 hours.   DDimer No results for input(s): DDIMER in the last 168 hours.   Radiology    Dg Chest 2 View  Result Date: 05/05/2019 CLINICAL DATA:  Chest pain. EXAM: CHEST - 2 VIEW COMPARISON:  September 08, 2018 FINDINGS: Stable cardiomegaly. Hila and mediastinum are normal. No pneumothorax. No overt edema. No focal infiltrate. IMPRESSION: No active cardiopulmonary disease. Electronically Signed   By: Dorise Bullion III M.D   On: 05/05/2019 19:20  Cardiac Studies   Cardiac catheterization (05/07/2019)  Conclusion    Ost LM lesion is 10% stenosed.  Ost Cx to Prox Cx lesion is 40% stenosed.  Prox RCA to Mid RCA lesion is 40% stenosed.  Dist RCA lesion is 100% stenosed.  Prox RCA lesion is 50% stenosed.  Ost 1st Mrg lesion is 50% stenosed.  Prox LAD to Mid LAD lesion is 30% stenosed.  There is moderate left ventricular systolic dysfunction.  LV end diastolic pressure is normal.   Significant multivessel coronary calcification with minimal smooth 10% narrowing in the left main, 30% proximal LAD stenosis, 40% ostial circumflex stenosis with 50% ostial narrowing in a very proximal atrial branch, and previously noted significant calcification of the RCA with 40% narrowing in the distal ostial stent, 40% mid stenosis and old total occlusion of the RCA at the acute margin.  There are extensive collaterals supplying the PDA and PLA vessels with supply  the inferior and inferolateral wall.  Moderate LV dysfunction with EF estimate in the 35 to 40% range with inferior hypocontractility and possible anterolateral hypocontractility.  LVEDP 10 mm.  RECOMMENDATION: Medical therapy with rate control of his atrial fibrillation and for his CAD..  The patient will be restarted on warfarin.  Lipid-lowering therapy if tolerated with target LDL less than 70.    Intervention    Patient Profile     78 y.o. male with multivessel CAD (last LHC in 01/2018), AF (on warfarin), DMII, andHTN who presented to the ED from home with the chief complaint of chest pain. Cardiology is consulted for evaluation of NSTEMI (hs-TnT 32-> 60 -> 91).  Cardiac catheterization performed this morning revealed unchanged anatomy with an occluded distal RCA, left-to-right collaterals and noncritical disease in the left system.  Assessment & Plan    1. Unstable Angina with mild elevated Troponin: episode happened with exertion and relieved with 3 SL nitro. Blood pressure was also elevated at [id number] systolic. No further chest pain since admission. HsT peaked at 91. Does have known CAD on cath back in 5/19. Symptoms were concerning for ACS as relieved with SL nitro. Cr up at 1.64 today.  He underwent diagnostic coronary angiography this morning by Dr. Claiborne Billings via the right radial approach revealing unchanged anatomy for an occluded distal RCA, left to right collaterals and noncritical disease in the left system.  His EF was in the 35 to 40% range.  2. HTN: improved since admission but still elevated. Continue Coreg and restart Dilt at 180mg .   3. Permanent Afib: rate controlled. Coumadin held.  Okay to restart Coumadin as an outpatient.  Patient has not had a stroke in the past.  4. Right renal artery stenosis: '06 this was treated medically. Reports having a recent MRI of the right kidney which was fine per his report. May be contributing to his HTN.   5. Hyperlipidemia: LDL elevated  at 159. Has refused statins in the past. Consider PCSK9s as an outpatient.  6. AKI: baseline Cr unclear. Up to 1.64 yesterday, improved to 1.42 this morning after IV hydration today.    From our point of view, the patient is stable for discharge.  He can start his home Coumadin dose with INR checks as an outpatient.  Would follow-up with Dr. Geraldo Pitter within 7 to 10 days.  We will discuss with him the possibility of starting Repatha as an outpatient.   For questions or updates, please contact [place] Please consult [email or web address] for contact info under  Lorretta Harp, M.D., [place], [facility], [place] [address]. [place], [place]  [address]  [phone] 05/08/2019 10:17 AM

## 2019-05-07 NOTE — Progress Notes (Addendum)
Subjective:   Marc [name] stated that he feels a lot better. Denied chest pain since Saturday. He is feeling well overall and wishes to know what the plan moving forward might be. He would like for the cardiology team to call his daughter as well as the primary team. He is agreeable to their recommendations.  He was counseled on the need for high cholesterol medication but stated that he feels down/bad on statin medications. He denied muscle aches or pain but does not wish to continue taking the statin. He denied pain overnight after receiving a dose of a statin.    Objective: Vital signs in last 24 hours: Vitals:   05/06/19 1711 05/06/19 2001 05/07/19 0500 05/07/19 0600  BP: (!) 170/100 (!) 159/113 (!) 151/76   Pulse: 62 (!) 46 62   Resp: 20 18    Temp: 97.7 F (36.5 C) 98.3 F (36.8 C) 98.5 F (36.9 C)   TempSrc: Oral Oral Oral   SpO2: 98% 97% 99%   Weight:    85 kg  Height:       Physical Exam Vitals signs and nursing note reviewed.  Constitutional:      General: He is not in acute distress.    Appearance: He is normal weight. He is not toxic-appearing.  Cardiovascular:     Rate and Rhythm: Normal rate. Rhythm irregular.     Heart sounds: No systolic murmur. No diastolic murmur.  Pulmonary:     Effort: Pulmonary effort is normal. No respiratory distress.  Skin:    General: Skin is warm and dry.     Findings: No erythema.  Neurological:     General: No focal deficit present.     Mental Status: He is alert and oriented to person, place, and time.  Psychiatric:        Mood and Affect: Mood normal.        Behavior: Behavior normal.    Assessment/Plan:  Principal Problem:   Chest pain Active Problems:   Atrial fibrillation (HCC)   Coronary artery disease   Hypertension   Chronic kidney disease, stage III (moderate) (HCC)  # Unstable Angina: # CAD s/p multivessel stenting   # Hypertension Given significant cardiac past medical history requiring multiple stents,  patient is being considered for additional ischemic work up by cardiology. Given he may have difficulty tolerating aspirin or other anti-platelets, unsure if he is the best candidate for cath. However, will follow up cardiology recommendations. Will work on optimizing CAD medication regimen.   We stopped Diltiazem yesterday due to patient's reduced EF of 40-45% in 09/2018. Will follow up Cards recommendations to see their thoughts on this.   - Cardiology is on board and we appreciate their recommendations. - Continue Coreg - Dilitazem per Cardiology  # Hyperlipidemia:  # History of Statin induced myalgias  In regards to taking a statin, patient reports developing bony pain and malaise in the past.  However, his LDL increased significantly in the past year from 84 to 159. He is willing to try a course of a hydrophilic statin. Will check Vitamin D and TSH to make sure nothing else would contribute to statin induced myalgia.   - Rosuvastatin 40mg    # Chronic HFrEF:  He takes Imdur and Lasix at home. His last echo was in 09/2018 with a Ef of 40-45% with moderate LV hypokinesis. This is improved from his prior   - Continue home Imdur and Lasix  # Permanent A. Fib Per daughter, patient  was unable to tolerate Xarelto in the past and he prefers Warfarin.   - Warfarin per pharmacy - INR tomorrow AM  # Diabetes Mellitus:  Home medication includes Glipizide 5mg  and Lantus 10 units at bedtime. This has been his regimen for a long time now. He denies discussing with his PCP other diabetic medications that would also benefit his other chronic medical conditions. He would be a good candidate for GLP1 vs SGLT2 agents given their cardiac and renal protection. Overall, DM well controlled with A1c of 7.0%. Recommend outpatient follow up regarding this since switching medication will require additional monitoring. Will discuss this in discharge summary.  - SSI while admitted.    # History of Renal Artery  Stenosis:  # CKD3 Patient has a history of RAS with subtotal occlusion on the right side. PCP note states creatinine baseline between 1-2-1.8.   - Repeat BMP tomorrow to reassess creatinine   Dispo: Anticipated discharge pending further medical evaluation.   Dr. Jose Persia Internal Medicine PGY-1  Pager: [phone] 05/07/2019, 6:32 AM

## 2019-05-07 NOTE — H&P (View-Only) (Signed)
Progress Note  Patient Name: Marc [name] Date of Encounter: 05/07/2019  Primary Cardiologist: Jenean Lindau, MD   Subjective   No chest pain overnight.   Inpatient Medications    Scheduled Meds: . aspirin  81 mg Oral Daily  . carvedilol  25 mg Oral BID WC  . insulin aspart  0-5 Units Subcutaneous QHS  . insulin aspart  0-9 Units Subcutaneous TID WC  . isosorbide mononitrate  15 mg Oral QHS  . rosuvastatin  40 mg Oral q1800  . sertraline  25 mg Oral Daily  . tamsulosin  0.4 mg Oral QHS   Continuous Infusions: . heparin 1,200 Units/hr (05/07/19 0704)   PRN Meds: nitroGLYCERIN, traMADol-acetaminophen   Vital Signs    Vitals:   05/07/19 0500 05/07/19 0600 05/07/19 0759 05/07/19 0900  BP: (!) 151/76  (!) 141/81   Pulse: 62  (!) 42 92  Resp:   15   Temp: 98.5 F (36.9 C)  99 F (37.2 C)   TempSrc: Oral  Oral   SpO2: 99%  96%   Weight:  85 kg    Height:        Intake/Output Summary (Last 24 hours) at 05/07/2019 1110 Last data filed at 05/07/2019 0950 Gross per 24 hour  Intake 1704.15 ml  Output 875 ml  Net 829.15 ml   Last 3 Weights 05/07/2019 05/06/2019 05/05/2019  Weight (lbs) 187 lb 4.8 oz 187 lb 14.4 oz 188 lb 1.6 oz  Weight (kg) 84.959 kg 85.231 kg 85.322 kg      Telemetry    Afib, rate controlled. NSVT - Personally Reviewed  ECG    No new tracing today - Personally Reviewed  Physical Exam  Pleasant older WM GEN: No acute distress.   Neck: No JVD Cardiac: Irreg, Irreg, no murmurs, rubs, or gallops.  Respiratory: Clear to auscultation bilaterally. GI: Soft, nontender, non-distended  MS: No edema; No deformity. Neuro:  Nonfocal  Psych: Normal affect   Labs    High Sensitivity Troponin:   Recent Labs  Lab 05/05/19 1825 05/05/19 2225 05/06/19 0213 05/06/19 0425 05/06/19 0637  TROPONINIHS 32* 60* 91* 97* 80*      Chemistry Recent Labs  Lab 05/05/19 1825 05/06/19 0425 05/07/19 0544  NA 141 143 142  K 4.0 3.9 3.9  CL 108 109  110  CO2 22 21* 23  GLUCOSE 164* 106* 130*  BUN 25* 23 27*  CREATININE 1.40* 1.51* 1.64*  CALCIUM 9.8 9.7 9.6  GFRNONAA 48* 44* 40*  GFRAA 56* 51* 46*  ANIONGAP 11 13 9      Hematology Recent Labs  Lab 05/05/19 1825 05/06/19 0637 05/07/19 0544  WBC 6.4 6.1 6.6  RBC 4.96 4.82 4.78  HGB 14.7 14.3 14.0  HCT 44.5 42.0 42.5  MCV 89.7 87.1 88.9  MCH 29.6 29.7 29.3  MCHC 33.0 34.0 32.9  RDW 13.8 13.8 13.9  PLT 148* 123* 115*    BNPNo results for input(s): BNP, PROBNP in the last 168 hours.   DDimer No results for input(s): DDIMER in the last 168 hours.   Radiology    Dg Chest 2 View  Result Date: 05/05/2019 CLINICAL DATA:  Chest pain. EXAM: CHEST - 2 VIEW COMPARISON:  September 08, 2018 FINDINGS: Stable cardiomegaly. Hila and mediastinum are normal. No pneumothorax. No overt edema. No focal infiltrate. IMPRESSION: No active cardiopulmonary disease. Electronically Signed   By: Dorise Bullion III M.D   On: 05/05/2019 19:20    Cardiac Studies  N/a   Patient Profile     78 y.o. male with multivessel CAD (last LHC in 01/2018), AF (on warfarin), DMII, andHTN who presented to the ED from home with the chief complaint of chest pain. Cardiology is consulted for evaluation of NSTEMI (hs-TnT 32-> 60 -> 91).  Assessment & Plan    1. Unstable Angina with mild elevated Troponin: episode happened with exertion and relieved with 3 SL nitro. Blood pressure was also elevated at 99991111 systolic. No further chest pain since admission. HsT peaked at 91. Does have known CAD on cath back in 5/19. Symptoms are concerning for ACS as relieved with SL nitro. Cr up at 1.64 today. Will plan for IV hydration and repeat INR in the morning. If stable will plan for cath.   2. HTN: improved since admission but still elevated. Continue Coreg and restart Dilt at 180mg .   3. Permanent Afib: rate controlled. Coumadin held, on IV heparin with plans for cath.   4. Right renal artery stenosis: '06 this was  treated medically. Reports having a recent MRI of the right kidney which was fine per his report. May be contributing to his HTN.   5. Hyperlipidemia: LDL elevated at 159. Has refused statins in the past. Consider PCSK9s as an outpatient.  6. AKI: baseline Cr unclear. Up to 1.64 today. Will give IV hydration today. BMET in the morning.    For questions or updates, please contact [place] Please consult [email or web address] for contact info under        Signed, Reino Bellis, NP  05/07/2019, 11:10 AM    Agree with note by Reino Bellis NP-C  Marc [name] was admitted with nitrate responsive chest pain.  He was cathed back in May 2019 by Dr. Fletcher Anon revealing occluded RCA with moderate circumflex disease his enzymes were fairly low.  He said no recurrent symptoms.  He does have moderate renal insufficiency.  He has no symptoms or signs of heart failure.  His EF is moderately depressed at 45%.  I am going to gently hydrate him and plan on performing diagnostic coronary angiography tomorrow with limited contrast.  He is on Coumadin for A. fib with a subtherapeutic INR.  This is on hold and he is on IV heparin.  Lorretta Harp, M.D., [place], [facility], [place] [address] [place], [place]  [address]  [phone] 05/07/2019 12:47 PM

## 2019-05-08 ENCOUNTER — Encounter (HOSPITAL_COMMUNITY): Payer: Self-pay | Admitting: Cardiovascular Disease

## 2019-05-08 ENCOUNTER — Encounter (HOSPITAL_COMMUNITY)
Admission: EM | Disposition: A | Discharge status: Home / Self Care | Payer: Self-pay | Source: Home / Self Care | Attending: Internal Medicine

## 2019-05-08 ENCOUNTER — Inpatient Hospital Stay (HOSPITAL_COMMUNITY): Payer: Medicare Other

## 2019-05-08 DIAGNOSIS — N183 Chronic kidney disease, stage 3 (moderate): Secondary | ICD-10-CM

## 2019-05-08 DIAGNOSIS — Z881 Allergy status to other antibiotic agents status: Secondary | ICD-10-CM

## 2019-05-08 DIAGNOSIS — Z7982 Long term (current) use of aspirin: Secondary | ICD-10-CM

## 2019-05-08 DIAGNOSIS — R778 Other specified abnormalities of plasma proteins: Secondary | ICD-10-CM

## 2019-05-08 DIAGNOSIS — I340 Nonrheumatic mitral (valve) insufficiency: Secondary | ICD-10-CM

## 2019-05-08 DIAGNOSIS — I25110 Atherosclerotic heart disease of native coronary artery with unstable angina pectoris: Principal | ICD-10-CM

## 2019-05-08 DIAGNOSIS — I351 Nonrheumatic aortic (valve) insufficiency: Secondary | ICD-10-CM

## 2019-05-08 HISTORY — PX: LEFT HEART CATH AND CORONARY ANGIOGRAPHY: CATH118249

## 2019-05-08 LAB — BASIC METABOLIC PANEL
Anion gap: 8 (ref 5–15)
BUN: 26 mg/dL — ABNORMAL HIGH (ref 8–23)
CO2: 23 mmol/L (ref 22–32)
Calcium: 9.4 mg/dL (ref 8.9–10.3)
Chloride: 111 mmol/L (ref 98–111)
Creatinine, Ser: 1.42 mg/dL — ABNORMAL HIGH (ref 0.61–1.24)
GFR calc Af Amer: 55 mL/min — ABNORMAL LOW (ref >60)
GFR calc non Af Amer: 47 mL/min — ABNORMAL LOW (ref >60)
Glucose, Bld: 133 mg/dL — ABNORMAL HIGH (ref 70–99)
Potassium: 4.1 mmol/L (ref 3.5–5.1)
Sodium: 142 mmol/L (ref 135–145)

## 2019-05-08 LAB — VITAMIN D 25 HYDROXY (VIT D DEFICIENCY, FRACTURES): Vit D, 25-Hydroxy: 22.3 ng/mL — ABNORMAL LOW (ref 30.0–100.0)

## 2019-05-08 LAB — CBC
HCT: 41.7 % (ref 39.0–52.0)
Hemoglobin: 13.9 g/dL (ref 13.0–17.0)
MCH: 29.3 pg (ref 26.0–34.0)
MCHC: 33.3 g/dL (ref 30.0–36.0)
MCV: 87.8 fL (ref 80.0–100.0)
Platelets: 125 10*3/uL — ABNORMAL LOW (ref 150–400)
RBC: 4.75 MIL/uL (ref 4.22–5.81)
RDW: 13.8 % (ref 11.5–15.5)
WBC: 6 10*3/uL (ref 4.0–10.5)
nRBC: 0 % (ref 0.0–0.2)

## 2019-05-08 LAB — GLUCOSE, CAPILLARY
Glucose-Capillary: 128 mg/dL — ABNORMAL HIGH (ref 70–99)
Glucose-Capillary: 200 mg/dL — ABNORMAL HIGH (ref 70–99)

## 2019-05-08 LAB — HEPARIN LEVEL (UNFRACTIONATED): Heparin Unfractionated: 0.41 IU/mL (ref 0.30–0.70)

## 2019-05-08 LAB — PROTIME-INR
INR: 1.7 — ABNORMAL HIGH (ref 0.8–1.2)
Prothrombin Time: 19.7 seconds — ABNORMAL HIGH (ref 11.4–15.2)

## 2019-05-08 LAB — ECHOCARDIOGRAM COMPLETE
Height: 72 in
Weight: 2934.76 oz

## 2019-05-08 SURGERY — LEFT HEART CATH AND CORONARY ANGIOGRAPHY
Anesthesia: LOCAL

## 2019-05-08 MED ORDER — VERAPAMIL HCL 2.5 MG/ML IV SOLN
INTRAVENOUS | Status: DC | PRN
  Administered 2019-05-08: 10 mL via INTRA_ARTERIAL

## 2019-05-08 MED ORDER — VERAPAMIL HCL 2.5 MG/ML IV SOLN
INTRAVENOUS | Status: AC
  Filled 2019-05-08: qty 2

## 2019-05-08 MED ORDER — SODIUM CHLORIDE 0.9 % IV SOLN: 250.0000 mL | INTRAVENOUS | Status: DC | PRN

## 2019-05-08 MED ORDER — HYDRALAZINE HCL 20 MG/ML IJ SOLN: 10.0000 mg | INTRAMUSCULAR | Status: AC | PRN

## 2019-05-08 MED ORDER — LABETALOL HCL 5 MG/ML IV SOLN: 10.0000 mg | INTRAVENOUS | Status: AC | PRN

## 2019-05-08 MED ORDER — HEPARIN (PORCINE) IN NACL 1000-0.9 UT/500ML-% IV SOLN
INTRAVENOUS | Status: DC | PRN
  Administered 2019-05-08: 500 mL

## 2019-05-08 MED ORDER — SODIUM CHLORIDE 0.9% FLUSH: 3.0000 mL | INTRAVENOUS | Status: DC | PRN

## 2019-05-08 MED ORDER — ASPIRIN 81 MG PO CHEW
81.0000 mg | CHEWABLE_TABLET | ORAL | Status: AC
  Administered 2019-05-08: 81 mg via ORAL
  Filled 2019-05-08: qty 1

## 2019-05-08 MED ORDER — SODIUM CHLORIDE 0.9% FLUSH: 3.0000 mL | Freq: Two times a day (BID) | INTRAVENOUS | Status: DC

## 2019-05-08 MED ORDER — FENTANYL CITRATE (PF) 100 MCG/2ML IJ SOLN
INTRAMUSCULAR | Status: DC | PRN
  Administered 2019-05-08: 25 ug via INTRAVENOUS

## 2019-05-08 MED ORDER — WARFARIN - PHARMACIST DOSING INPATIENT: Freq: Every day | Status: DC

## 2019-05-08 MED ORDER — HEPARIN SODIUM (PORCINE) 1000 UNIT/ML IJ SOLN
INTRAMUSCULAR | Status: DC | PRN
  Administered 2019-05-08: 4500 [IU] via INTRAVENOUS

## 2019-05-08 MED ORDER — MIDAZOLAM HCL 2 MG/2ML IJ SOLN
INTRAMUSCULAR | Status: DC | PRN
  Administered 2019-05-08: 1 mg via INTRAVENOUS

## 2019-05-08 MED ORDER — LIDOCAINE HCL (PF) 1 % IJ SOLN
INTRAMUSCULAR | Status: DC | PRN
  Administered 2019-05-08: 3 mL

## 2019-05-08 MED ORDER — MIDAZOLAM HCL 2 MG/2ML IJ SOLN
INTRAMUSCULAR | Status: AC
  Filled 2019-05-08: qty 2

## 2019-05-08 MED ORDER — ASPIRIN 81 MG PO CHEW: 81.0000 mg | CHEWABLE_TABLET | Freq: Every day | ORAL | Status: DC

## 2019-05-08 MED ORDER — SODIUM CHLORIDE 0.9 % IV SOLN: INTRAVENOUS | Status: DC

## 2019-05-08 MED ORDER — HEPARIN SODIUM (PORCINE) 1000 UNIT/ML IJ SOLN
INTRAMUSCULAR | Status: AC
  Filled 2019-05-08: qty 1

## 2019-05-08 MED ORDER — DILTIAZEM HCL ER COATED BEADS 180 MG PO CP24: 180.0000 mg | ORAL_CAPSULE | Freq: Every day | ORAL | 3 refills | Status: AC

## 2019-05-08 MED ORDER — WARFARIN SODIUM 5 MG PO TABS: 5.0000 mg | ORAL_TABLET | Freq: Once | ORAL | Status: DC

## 2019-05-08 MED ORDER — LIDOCAINE HCL (PF) 1 % IJ SOLN
INTRAMUSCULAR | Status: AC
  Filled 2019-05-08: qty 30

## 2019-05-08 MED ORDER — FENTANYL CITRATE (PF) 100 MCG/2ML IJ SOLN
INTRAMUSCULAR | Status: AC
  Filled 2019-05-08: qty 2

## 2019-05-08 MED ORDER — ONDANSETRON HCL 4 MG/2ML IJ SOLN: 4.0000 mg | Freq: Four times a day (QID) | INTRAMUSCULAR | Status: DC | PRN

## 2019-05-08 MED ORDER — IOHEXOL 350 MG/ML SOLN
INTRAVENOUS | Status: DC | PRN
  Administered 2019-05-08: 85 mL via INTRA_ARTERIAL

## 2019-05-08 MED ORDER — CHOLECALCIFEROL 20 MCG (800 UNIT) PO TABS: 1.0000 | ORAL_TABLET | Freq: Every day | ORAL | 2 refills | Status: DC

## 2019-05-08 MED ORDER — HEPARIN (PORCINE) IN NACL 1000-0.9 UT/500ML-% IV SOLN
INTRAVENOUS | Status: AC
  Filled 2019-05-08: qty 1500

## 2019-05-08 MED ORDER — ACETAMINOPHEN 325 MG PO TABS: 650.0000 mg | ORAL_TABLET | ORAL | Status: DC | PRN

## 2019-05-08 MED ORDER — ROSUVASTATIN CALCIUM 40 MG PO TABS: 40.0000 mg | ORAL_TABLET | Freq: Every day | ORAL | 0 refills | Status: DC

## 2019-05-08 SURGICAL SUPPLY — 10 items
CATH INFINITI 5FR ANG PIGTAIL (CATHETERS) ×1 IMPLANT
CATH OPTITORQUE TIG 4.0 5F (CATHETERS) ×1 IMPLANT
DEVICE RAD COMP TR BAND LRG (VASCULAR PRODUCTS) ×1 IMPLANT
GLIDESHEATH SLEND SS 6F .021 (SHEATH) ×1 IMPLANT
GUIDEWIRE INQWIRE 1.5J.035X260 (WIRE) IMPLANT
INQWIRE 1.5J .035X260CM (WIRE) ×2
KIT HEART LEFT (KITS) ×2 IMPLANT
PACK CARDIAC CATHETERIZATION (CUSTOM PROCEDURE TRAY) ×2 IMPLANT
TRANSDUCER W/STOPCOCK (MISCELLANEOUS) ×2 IMPLANT
TUBING CIL FLEX 10 FLL-RA (TUBING) ×2 IMPLANT

## 2019-05-08 NOTE — Discharge Summary (Signed)
Name: Marc [name] MRN: [id number] DOB: Mar 02, 1941 78 y.o. PCP: Raina Mina., MD  Date of Admission: 05/05/2019  6:07 PM Date of Discharge: 05/08/2019 Attending Physician: Axel Filler, *  Discharge Diagnosis: 1. Unstable angina with mild elevated troponin  Discharge Medications: Allergies as of 05/08/2019      Reactions   Lisinopril Shortness Of Breath      Ticagrelor Shortness Of Breath, Other (See Comments)   * Brilinta* Pt short of breath   Cephalexin Diarrhea, Nausea And Vomiting      Prednisone Other (See Comments)   GI Issues   Simvastatin Other (See Comments)   Unsure      Medication List    TAKE these medications   carvedilol 25 MG tablet Commonly known as: COREG TAKE 1 TABLET BY MOUTH TWICE DAILY WITH A MEAL What changed: See the new instructions.   Cholecalciferol 20 MCG (800 UNIT) Tabs Take 1 tablet by mouth daily.   CoQ10 100 MG Caps Take 100 mg by mouth daily.   diltiazem 180 MG 24 hr capsule Commonly known as: CARDIZEM CD Take 1 capsule (180 mg total) by mouth daily. Start taking on: May 09, 2019 What changed:   medication strength  how much to take   furosemide 20 MG tablet Commonly known as: LASIX Take 1 tablet by mouth every other day.   glipiZIDE 5 MG 24 hr tablet Commonly known as: GLUCOTROL XL Take 5 mg by mouth daily.   isosorbide mononitrate 30 MG 24 hr tablet Commonly known as: IMDUR Take 0.5 tablets (15 mg total) by mouth daily. What changed: when to take this   Lantus SoloStar 100 UNIT/ML Solostar Pen Generic drug: Insulin Glargine Inject 10 Units into the skin at bedtime.   nitroGLYCERIN 0.4 MG SL tablet Commonly known as: NITROSTAT Place 1 tablet (0.4 mg total) under the tongue every 5 (five) minutes as needed for chest pain.   NovoFine 32G X 6 MM Misc Generic drug: Insulin Pen Needle USE AS DIRECTED.   rosuvastatin 40 MG tablet Commonly known as: CRESTOR Take 1 tablet (40 mg total) by mouth  daily at 6 PM.   sertraline 25 MG tablet Commonly known as: ZOLOFT Take 25 mg by mouth daily.   tamsulosin 0.4 MG Caps capsule Commonly known as: FLOMAX Take 0.4 mg by mouth at bedtime.   traMADol-acetaminophen 37.5-325 MG tablet Commonly known as: ULTRACET Take 1 tablet by mouth 2 (two) times daily.   warfarin 5 MG tablet Commonly known as: COUMADIN Take 5 mg by mouth daily.       Disposition and follow-up:   MarcMarc [name] was discharged from [facility] in Stable condition.  At the hospital follow up visit please address:  1.  Please check for compliance with INR measurements as patient is on warfarin.  Evaluate if he is taking his statin as he has prior history of noncompliance with this medication (patient was started on rosuvastatin).  Consider switching patient from glipizide to an SGLT2 versus GLP-1 agent given their cardiac and renal protective properties.  2.  Labs / imaging needed at time of follow-up: None  3.  Pending labs/ test needing follow-up: None  Follow-up Appointments: Follow-up Information    Raina Mina., MD. Schedule an appointment as soon as possible for a visit.   Specialty: Internal Medicine Contact information: [place] [address] [phone]        Revankar, Reita Cliche, MD .  Specialty: Cardiology Contact information: [place] [address] [phone]           Hospital Course by problem list: 1. Unstable angina with mild elevated troponin: Marc [name] is a 78 year old male who came to the emergency department from home with sudden onset, nonradiating, left-sided chest pain.  EKG was without ST elevation and had troponin trend of 60 -> 91 -> 97 -> 80.  Patient symptoms were concerning for ACS as they were relieved with SL nitro and patient has a history of coronary artery disease with stent placement. Given patient's increased risk, cardiac cath was performed.   This revealed unchanged anatomy from prior catheterization with an occluded distal RCA, left to right collaterals and noncritical disease in the left system.  2. Permanent A. Fib: Patient was restarted on his home warfarin at discharge.  Per patient's daughter, patient is compliant with this therapy and follows up every 2 weeks for INR checks.    3. Hypertension: Patient was continued on home dose of carvedilol 25 mg. Given patient's reduced ejection fraction, diltiazem was temporarily stopped but was restarted per cardiology recommendations at a lower dose of 150 mg daily (previous home dose was 300 mg).  4. Hyperlipidemia: LDL of 159. Patient previously refused statin as patient states that he did not feel good when taking them. Daughter states he had muscle pain but patient does not report this symptom. Started patient on rosuvastatin as this is a hydrophilic statin and lower risk of myalgias. Evaluated TSH and Vitamin D given potential myalgias. TSH was mildly elevated and Vitamin D was low. Started patient on cholecalciferol supplementation.  5. Diabetes Mellitus: Patient was placed on Lantus 10 units at bedtime with sliding scale insulin as inpatient.  Diabetes was managed without incident.  Patient was discharged on home medications of glipizide 5 mg and Lantus 10 units at bedtime.  Patient would be a good candidate for GLP-1 versus SGLT2 agents given their cardiac and renal protection. This can be considered as an outpatient. Overall his diabetes is well controlled with an A1c of 7.0%.   Discharge Vitals:   BP (!) 167/95    Pulse 94    Temp (!) 97.4 F (36.3 C) (Oral)    Resp 16    Ht 6' (1.829 m)    Wt 83.2 kg Comment: scale a   SpO2 97%    BMI 24.88 kg/m   Pertinent Labs, Studies, and Procedures:   Left Heart Cath and Coronary Angiography (05/08/2019):  Ost LM lesion is 10% stenosed.  Ost Cx to Prox Cx lesion is 40% stenosed.  Prox RCA to Mid RCA lesion is 40% stenosed.  Dist RCA  lesion is 100% stenosed.  Prox RCA lesion is 50% stenosed.  Ost 1st Mrg lesion is 50% stenosed.  Prox LAD to Mid LAD lesion is 30% stenosed.  There is moderate left ventricular systolic dysfunction.  LV end diastolic pressure is normal.   Significant multivessel coronary calcification with minimal smooth 10% narrowing in the left main, 30% proximal LAD stenosis, 40% ostial circumflex stenosis with 50% ostial narrowing in a very proximal atrial branch, and previously noted significant calcification of the RCA with 40% narrowing in the distal ostial stent, 40% mid stenosis and old total occlusion of the RCA at the acute margin.  There are extensive collaterals supplying the PDA and PLA vessels with supply the inferior and inferolateral wall.  Moderate LV dysfunction with EF estimate in the 35  to 40% range with inferior hypocontractility and possible anterolateral hypocontractility.  LVEDP 10 mm.  TTE (05/09/2019): IMPRESSIONS: 1. The left ventricle has moderately reduced systolic function, with an ejection fraction of 35-40%. The cavity size was mildly dilated. There is mildly increased left ventricular wall thickness. Left ventricular diastolic function could not be  evaluated secondary to atrial fibrillation. Left ventrical global hypokinesis without regional wall motion abnormalities.  2. The right ventricle has normal systolic function. The cavity was normal. There is no increase in right ventricular wall thickness. Right ventricular systolic pressure is normal with an estimated pressure of 27.2 mmHg.  3. Left atrial size was severely dilated.  4. Right atrial size was moderately dilated.  5. 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 Mild thickening of the mitral valve leaflet. Mitral valve regurgitation is mild to moderate by color flow Doppler. The MR jet is centrally-directed.  7. The aorta is normal unless otherwise noted.  8. The inferior vena cava was normal in  size with <50% respiratory variability.  9. No intracardiac thrombi or masses were visualized. 10. There is right bowing of the interatrial septum, suggestive of elevated left atrial pressure.  CXR (05/05/2019): IMPRESSION: No active cardiopulmonary disease.  CBC Latest Ref Rng & Units 05/08/2019 05/07/2019 05/06/2019  WBC 4.0 - 10.5 K/uL 6.0 6.6 6.1  Hemoglobin 13.0 - 17.0 g/dL 13.9 14.0 14.3  Hematocrit 39.0 - 52.0 % 41.7 42.5 42.0  Platelets 150 - 400 K/uL 125(L) 115(L) 123(L)   BMP Latest Ref Rng & Units 05/08/2019 05/07/2019 05/06/2019  Glucose 70 - 99 mg/dL 133(H) 130(H) 106(H)  BUN 8 - 23 mg/dL 26(H) 27(H) 23  Creatinine 0.61 - 1.24 mg/dL 1.42(H) 1.64(H) 1.51(H)  Sodium 135 - 145 mmol/L 142 142 143  Potassium 3.5 - 5.1 mmol/L 4.1 3.9 3.9  Chloride 98 - 111 mmol/L 111 110 109  CO2 22 - 32 mmol/L 23 23 21(L)  Calcium 8.9 - 10.3 mg/dL 9.4 9.6 9.7   Lipid Panel     Component Value Date/Time   CHOL 206 (H) 05/06/2019 0637   TRIG 92 05/06/2019 0637   HDL 29 (L) 05/06/2019 0637   CHOLHDL 7.1 05/06/2019 0637   VLDL 18 05/06/2019 0637   LDLCALC 159 (H) 05/06/2019 0637   Lab Results  Component Value Date   HGBA1C 7.0 (H) 05/05/2019   Lab Results  Component Value Date   INR 1.7 (H) 05/08/2019   INR 2.0 (H) 05/06/2019   INR 1.8 (H) 05/05/2019   Vitamin D 25 Hydroxy: 22.3 (low)  TSH: 4.681  Discharge Instructions: Discharge Instructions    AMB Referral to [facility]   Complete by: As directed    ConsiderRepatha if he does not tolerate rosuvastatin.   Reason for referral: Patients with statin intolerance (failed 2 statins, one of which must be a high potency statin)   Provider to see patient: MD-Dr.Hilty   Internal Lipid Clinic Referral Scheduling  Internal lipid clinic referrals are providers within [facility], who wish to refer established patients for routine management (help in starting PCSK9 inhibitor therapy) or advanced therapies.  Internal MD referral  criteria:              1. All patients with LDL>190 mg/dL  2. All patients with Triglycerides >500 mg/dL  3. Patients with suspected or confirmed heterozygous familial hyperlipidemia (HeFH) or homozygous familial hyperlipidemia (HoFH)  4. Patients with family history of suspicious for genetic dyslipidemia desiring genetic testing  5.  Patients refractory to standard guideline based therapy  6. Patients with statin intolerance (failed 2 statins, one of which must be a high potency statin)  7. Patients who the provider desires to be seen by MD   Internal PharmD referral criteria:   1. Follow-up patients for medication management  2. Follow-up for compliance monitoring  3. Patients for drug education  4. Patients with statin intolerance  5. PCSK9 inhibitor education and prior authorization approvals  6. Patients with triglycerides <500 mg/dL  External Lipid Clinic Referral  External lipid clinic referrals are for providers outside of [facility], considered new clinic patients - automatically routed to MD schedule   Call MD for:  persistant nausea and vomiting   Complete by: As directed    Call MD for:  redness, tenderness, or signs of infection (pain, swelling, redness, odor or green/yellow discharge around incision site)   Complete by: As directed    Diet - low sodium heart healthy   Complete by: As directed    Discharge instructions   Complete by: As directed    Continue to follow with your doctors for you warfarin monitoring as before. Your INR at discharge was 1.7.   We have added a hydrophilic statin known as rosuvastatin that should have more minimal side effects. It is important that you take this daily.   Increase activity slowly   Complete by: As directed       Signed: Jeanmarie Hubert, MD 05/09/2019, 5:59 PM Pager: [phone]

## 2019-05-08 NOTE — Progress Notes (Signed)
Progress Note  Patient Name: Marc [name] Date of Encounter: 05/08/2019 Primary Cardiologist: Jenean Lindau, MD   Subjective   No chest pain overnight.   Inpatient Medications    Scheduled Meds: . aspirin  81 mg Oral Daily  . carvedilol  25 mg Oral BID WC  . insulin aspart  0-5 Units Subcutaneous QHS  . insulin aspart  0-9 Units Subcutaneous TID WC  . isosorbide mononitrate  15 mg Oral QHS  . rosuvastatin  40 mg Oral q1800  . sertraline  25 mg Oral Daily  . tamsulosin  0.4 mg Oral QHS   Continuous Infusions: . heparin 1,200 Units/hr (05/07/19 0704)   PRN Meds: nitroGLYCERIN, traMADol-acetaminophen   Vital Signs          Vitals:   05/07/19 0500 05/07/19 0600 05/07/19 0759 05/07/19 0900  BP: (!) 151/76  (!) 141/81   Pulse: 62  (!) 42 92  Resp:   15   Temp: 98.5 F (36.9 C)  99 F (37.2 C)   TempSrc: Oral  Oral   SpO2: 99%  96%   Weight:  85 kg    Height:        Intake/Output Summary (Last 24 hours) at 05/07/2019 1110 Last data filed at 05/07/2019 0950    Gross per 24 hour  Intake 1704.15 ml  Output 875 ml  Net 829.15 ml   Last 3 Weights 05/07/2019 05/06/2019 05/05/2019  Weight (lbs) 187 lb 4.8 oz 187 lb 14.4 oz 188 lb 1.6 oz  Weight (kg) 84.959 kg 85.231 kg 85.322 kg      Telemetry    Afib, rate controlled. NSVT - Personally Reviewed  ECG    No new tracing today - Personally Reviewed  Physical Exam  Pleasant older WM GEN:No acute distress.   Neck:No JVD Cardiac:Irreg, Irreg, no murmurs, rubs, or gallops.  Respiratory:Clear to auscultation bilaterally. [id number], nontender, non-distended  MS:No edema; No deformity. Neuro:Nonfocal  Psych: Normal affect  Extremities: Right radial puncture site stable  Labs    High Sensitivity Troponin:   Last Labs          Recent Labs  Lab 05/05/19 1825 05/05/19 2225 05/06/19 0213 05/06/19 0425 05/06/19 0637  TROPONINIHS 32* 60* 91* 97* 80*         Chemistry Last Labs        Recent Labs  Lab 05/05/19 1825 05/06/19 0425 05/07/19 0544  NA 141 143 142  K 4.0 3.9 3.9  CL 108 109 110  CO2 22 21* 23  GLUCOSE 164* 106* 130*  BUN 25* 23 27*  CREATININE 1.40* 1.51* 1.64*  CALCIUM 9.8 9.7 9.6  GFRNONAA 48* 44* 40*  GFRAA 56* 51* 46*  ANIONGAP 11 13 9        Hematology Last Labs        Recent Labs  Lab 05/05/19 1825 05/06/19 0637 05/07/19 0544  WBC 6.4 6.1 6.6  RBC 4.96 4.82 4.78  HGB 14.7 14.3 14.0  HCT 44.5 42.0 42.5  MCV 89.7 87.1 88.9  MCH 29.6 29.7 29.3  MCHC 33.0 34.0 32.9  RDW 13.8 13.8 13.9  PLT 148* 123* 115*      BNP Last Labs   No results for input(s): BNP, PROBNP in the last 168 hours.     DDimer  Last Labs   No results for input(s): DDIMER in the last 168 hours.     Radiology     Imaging Results (Last 48 hours)  Dg Chest 2  View  Result Date: 05/05/2019 CLINICAL DATA:  Chest pain. EXAM: CHEST - 2 VIEW COMPARISON:  September 08, 2018 FINDINGS: Stable cardiomegaly. Hila and mediastinum are normal. No pneumothorax. No overt edema. No focal infiltrate. IMPRESSION: No active cardiopulmonary disease. Electronically Signed   By: Dorise Bullion III M.D   On: 05/05/2019 19:20     Cardiac Studies   Cardiac catheterization (05/07/2019)  Conclusion    Ost LM lesion is 10% stenosed.  Ost Cx to Prox Cx lesion is 40% stenosed.  Prox RCA to Mid RCA lesion is 40% stenosed.  Dist RCA lesion is 100% stenosed.  Prox RCA lesion is 50% stenosed.  Ost 1st Mrg lesion is 50% stenosed.  Prox LAD to Mid LAD lesion is 30% stenosed.  There is moderate left ventricular systolic dysfunction.  LV end diastolic pressure is normal.  Significant multivessel coronary calcification with minimal smooth 10% narrowing in the left main, 30% proximal LAD stenosis, 40% ostial circumflex stenosis with 50% ostial narrowing in a very proximal atrial branch, and previously noted significant calcification of the  RCA with 40% narrowing in the distal ostial stent, 40% mid stenosis and old total occlusion of the RCA at the acute margin. There are extensive collaterals supplying the PDA and PLA vessels with supply the inferior and inferolateral wall.  Moderate LV dysfunction with EF estimate in the 35 to 40% range with inferior hypocontractility and possible anterolateral hypocontractility. LVEDP 10 mm.  RECOMMENDATION: Medical therapy with rate control of his atrial fibrillation and for his CAD.. The patient will be restarted on warfarin. Lipid-lowering therapy if tolerated with target LDL less than 70.    Intervention    Patient Profile     78 y.o. male withmultivesselCAD (last LHC in 01/2018), AF (on warfarin), DMII,andHTN who presented to the ED from home with the chief complaint of chest pain. Cardiology isconsulted for evaluation ofNSTEMI (hs-TnT 32-> 60 -> 91).  Cardiac catheterization performed this morning revealed unchanged anatomy with an occluded distal RCA, left-to-right collaterals and noncritical disease in the left system.  Assessment & Plan    1. Unstable Angina with mild elevated Troponin: episode happened with exertion and relieved with 3 SL nitro. Blood pressure was also elevated at 99991111 systolic. No further chest pain since admission. HsT peaked at 91. Does have known CAD on cath back in 5/19. Symptoms were concerning for ACS as relieved with SL nitro. Cr up at 1.64 today.  He underwent diagnostic coronary angiography this morning by Dr. Claiborne Billings via the right radial approach revealing unchanged anatomy for an occluded distal RCA, left to right collaterals and noncritical disease in the left system.  His EF was in the 35 to 40% range.  2. HTN: improved since admission but still elevated. Continue Coreg and restart Dilt at 180mg .   3. Permanent Afib: rate controlled. Coumadin held.  Okay to restart Coumadin as an outpatient.  Patient has not had a stroke in the past.   4. Right renal artery stenosis: '06 this was treated medically. Reports having a recent MRI of the right kidney which was fine per his report. May be contributing to his HTN.   5. Hyperlipidemia: LDL elevated at 159. Has refused statins in the past. Consider PCSK9s as an outpatient.  6. AKI: baseline Cr unclear. Up to 1.64 yesterday, improved to 1.42 this morning after IV hydration today.    From our point of view, the patient is stable for discharge.  He can start his home Coumadin dose with  INR checks as an outpatient.  Would follow-up with Dr. Geraldo Pitter within 7 to 10 days.  We will discuss with him the possibility of starting Repatha as an outpatient.   For questions or updates, please contact [place] Please consult [email or web address] for contact info under        Lorretta Harp, M.D., [place], [facility], [place], [place] [address] [place], [place]  [address]          [phone] 05/08/2019 10:17 AM

## 2019-05-08 NOTE — Progress Notes (Signed)
[place] for warfarin Indication: chest pain/ACS  Allergies  Allergen Reactions  . Lisinopril Shortness Of Breath       . Ticagrelor Shortness Of Breath and Other (See Comments)    * Brilinta* Pt short of breath  . Cephalexin Diarrhea and Nausea And Vomiting       . Prednisone Other (See Comments)    GI Issues  . Simvastatin Other (See Comments)    Unsure    Patient Measurements: Height: 6' (182.9 cm) Weight: 183 lb 6.8 oz (83.2 kg)(scale a) IBW/kg (Calculated) : 77.6  Vital Signs: Temp: 97.8 F (36.6 C) (09/01 0531) Temp Source: Oral (09/01 0531) BP: 167/95 (09/01 1000) Pulse Rate: 94 (09/01 1000)  Labs: Recent Labs    05/05/19 1825  05/06/19 0213 05/06/19 0425 05/06/19 0637 05/06/19 1327 05/07/19 0544 05/08/19 0520  HGB 14.7  --   --   --  14.3  --  14.0 13.9  HCT 44.5  --   --   --  42.0  --  42.5 41.7  PLT 148*  --   --   --  123*  --  115* 125*  LABPROT 20.9*  --  22.6*  --   --   --   --  19.7*  INR 1.8*  --  2.0*  --   --   --   --  1.7*  HEPARINUNFRC  --   --   --   --   --  0.40 0.34 0.41  CREATININE 1.40*  --   --  1.51*  --   --  1.64* 1.42*  TROPONINIHS 32*   < > 91* 97* 80*  --   --   --    < > = values in this interval not displayed.    Estimated Creatinine Clearance: 47.8 mL/min (A) (by C-G formula based on SCr of 1.42 mg/dL (H)).   Medical History: Past Medical History:  Diagnosis Date  . AAA (abdominal aortic aneurysm) (HCC)    small. An abdominal ultrasound in 2011 showed no aneurysm  . ACS (acute coronary syndrome) ([place])   . Arthritis   . Atrial fibrillation ([place])    permanent  . Coronary artery disease   . Diabetes mellitus without complication ([place])   . Hyperlipidemia   . Hypertension   . Mitral regurgitation    with MVP. Mild to moderate  . Myocardial infarction ([place])   . Renal artery stenosis ([place])   . Renal artery stenosis in 1 of 2 vessels ([place]) 2006   subtotal occlusion of right  renal artery. Treated medically.   . Sexual dysfunction     Assessment: 78 y.o. male with chest pain, h/o Afib (CHADSVASC= 6) on Warfarin PTA (patient was unable to tolerate Xarelto in the past and he prefers Warfarin). He is now s/p cath with multivessel disease and pharmacy to dose warfarin. Plans are for medical management. No heparin bridge needed now per cardiology.  -INR= 1.7.   PTA warfarin regimen: 5 mg every morning   Goal of Therapy:  Heparin level 0.3-0.7 units/ml Monitor platelets by anticoagulation protocol: Yes   Plan:  -Warfarin 5mg  po today -Daily PT/INR  Hildred Laser, PharmD Clinical Pharmacist **Pharmacist phone directory can now be found on [email or web address] ([email or web address]).  Listed under [place].

## 2019-05-08 NOTE — Interval H&P Note (Signed)
Cath Lab Visit (complete for each Cath Lab visit)  Clinical Evaluation Leading to the Procedure:   ACS: No.  Non-ACS:    Anginal Classification: CCS III  Anti-ischemic medical therapy: Maximal Therapy (2 or more classes of medications)  Non-Invasive Test Results: No non-invasive testing performed  Prior CABG: No previous CABG      History and Physical Interval Note:  05/08/2019 7:42 AM  Marc [name]  has presented today for surgery, with the diagnosis of unstable angina.  The various methods of treatment have been discussed with the patient and family. After consideration of risks, benefits and other options for treatment, the patient has consented to  Procedure(s): LEFT HEART CATH AND CORONARY ANGIOGRAPHY (N/A) as a surgical intervention.  The patient's history has been reviewed, patient examined, no change in status, stable for surgery.  I have reviewed the patient's chart and labs.  Questions were answered to the patient's satisfaction.     Shelva Majestic

## 2019-05-08 NOTE — Progress Notes (Signed)
Subjective:  Pt seen at the bedside on rounds this AM. Daughter at the bedside. The patient says he had the catheterization procedure done and was told there was no new findings. Daughter says they talked to cards and they recommend trying a different statin than than the one he d/c. Denies having chest pain or SOB today. We reinforced the importance of taking his medicines every day to lower his risk of stroke. Pt endorsed understanding. Does not want to take aspirin because he is concerned about bleeding. Pt says he's ready to go home today.  Pt says his other daughter, Santiago Glad takes care of his medicine. Her contact is [phone]. Santiago Glad states that patient reported having a "hard time breathing" when taking xarelto in the past. Also reported similar symptoms when taking eliquis. Daughter thinks these symptoms were psychological. Patient reportedly has issues with meds when they come from a different manufacturer and look different (different color/shape). Daughter reports patient was taking warfarin 5 mg daily and getting his INR checked very 2 weeks.   Objective:    Vital Signs (last 24 hours): Vitals:   05/07/19 0900 05/07/19 1257 05/07/19 1927 05/08/19 0531  BP:  (!) 157/104 (!) 147/88 127/72  Pulse: 92 92 73 93  Resp:  16 18 18   Temp:   (!) 97.5 F (36.4 C) 97.8 F (36.6 C)  TempSrc:    Oral  SpO2:  97%  95%  Weight:    83.2 kg  Height:        Physical Exam: General Alert and oriented, no acute distress  Cardiac Regular rate and rhythm, no murmurs, rubs, or gallops  Pulmonary Clear to auscultation bilaterally without wheezes, rhonchi, or rales  Extremities No peripheral edema      Assessment/Plan:   Principal Problem:   Chest pain Active Problems:   Atrial fibrillation (HCC)   Coronary artery disease   Hypertension   Chronic kidney disease, stage III (moderate) (HCC)  # Unstable Angina: # CAD s/p multivessel stenting   # Hypertension Cardiac cath performed  today which showed multivessel coronary calcification. Cards recommends medical therapy with rate control of atrial fibrillation and for his CAD. - Continue carvedilol 25 mg BID - Diltiazem  180 mg QD  # Hyperlipidemia:  # History of Statin induced myalgias  Patient with history of "feeling bad" when on statin. Vitamin D and TSH checked as could be contributing to myalgia. TSH WNL and Vitamin D low at 22.3. - Aspirin 81 mg QD - Rosuvastatin 40mg  - lipophilic so may be better tolerated - Will start Vit D3 supplement at discharge  # Chronic HFrEF:  He takes Imdur and Lasix at home. His last echo was in 09/2018 with a Ef of 40-45% with moderate LV hypokinesis. - Continue home Imdur and Lasix  # Permanent A. Fib - Will restart patient on warfarin at discharge. Per daughter, patient was taking this consistently and following consistently for INR checks  # Diabetes Mellitus:  Home medication includes Glipizide 5mg  and Lantus 10 units at bedtime. This has been his regimen for a long time now. He denies discussing with his PCP other diabetic medications that would also benefit his other chronic medical conditions. He would be a good candidate for GLP1 vs SGLT2 agents given their cardiac and renal protection.  - SSI while admitted. - Require 2 Units over past 24 hours   # History of Renal Artery Stenosis:  # CKD3 - Patient has a history of RAS with subtotal occlusion on  the right side. PCP note states creatinine baseline between 1-2-1.8.  - Creatinine at baseline  Dispo: Plan for discharge today.    Jeanmarie Hubert, MD 05/08/2019, 7:17 AM Pager: [phone]

## 2019-05-08 NOTE — Plan of Care (Signed)

## 2019-05-08 NOTE — Progress Notes (Signed)
  Echocardiogram 2D Echocardiogram has been performed.  Malayna Noori L Androw 05/08/2019, 11:23 AM

## 2019-05-14 LAB — VITAMIN D 1,25 DIHYDROXY
Vitamin D 1, 25 (OH)2 Total: 103 pg/mL — ABNORMAL HIGH
Vitamin D2 1, 25 (OH)2: 67 pg/mL
Vitamin D3 1, 25 (OH)2: 36 pg/mL

## 2019-05-17 ENCOUNTER — Ambulatory Visit: Payer: Medicare Other | Admitting: Cardiology

## 2019-05-21 ENCOUNTER — Ambulatory Visit (INDEPENDENT_AMBULATORY_CARE_PROVIDER_SITE_OTHER): Payer: Medicare Other | Admitting: Cardiology

## 2019-05-21 ENCOUNTER — Encounter: Payer: Self-pay | Admitting: Cardiology

## 2019-05-21 ENCOUNTER — Other Ambulatory Visit: Payer: Self-pay

## 2019-05-21 VITALS — BP 132/70 | HR 68 | Ht 72.0 in | Wt 192.0 lb

## 2019-05-21 DIAGNOSIS — I10 Essential (primary) hypertension: Secondary | ICD-10-CM | POA: Diagnosis not present

## 2019-05-21 DIAGNOSIS — I5042 Chronic combined systolic (congestive) and diastolic (congestive) heart failure: Secondary | ICD-10-CM | POA: Diagnosis not present

## 2019-05-21 DIAGNOSIS — E782 Mixed hyperlipidemia: Secondary | ICD-10-CM

## 2019-05-21 DIAGNOSIS — I4819 Other persistent atrial fibrillation: Secondary | ICD-10-CM

## 2019-05-21 DIAGNOSIS — I2510 Atherosclerotic heart disease of native coronary artery without angina pectoris: Secondary | ICD-10-CM

## 2019-05-21 DIAGNOSIS — E119 Type 2 diabetes mellitus without complications: Secondary | ICD-10-CM

## 2019-05-21 DIAGNOSIS — Z794 Long term (current) use of insulin: Secondary | ICD-10-CM

## 2019-05-21 MED ORDER — FUROSEMIDE 20 MG PO TABS: 20.0000 mg | ORAL_TABLET | ORAL | 5 refills | Status: AC

## 2019-05-21 NOTE — Patient Instructions (Signed)
Medication Instructions:  Your physician recommends that you continue on your current medications as directed. Please refer to the Current Medication list given to you today.  If you need a refill on your cardiac medications before your next appointment, please call your pharmacy.   Lab work: None If you have labs (blood work) drawn today and your tests are completely normal, you will receive your results only by: Marland Kitchen MyChart Message (if you have MyChart) OR . A paper copy in the mail If you have any lab test that is abnormal or we need to change your treatment, we will call you to review the results.  Testing/Procedures: None  Follow-Up: At [facility], you and your health needs are our priority.  As part of our continuing mission to provide you with exceptional heart care, we have created designated Provider Care Teams.  These Care Teams include your primary Cardiologist (physician) and Advanced Practice Providers (APPs -  Physician Assistants and Nurse Practitioners) who all work together to provide you with the care you need, when you need it. You will need a follow up appointment in 3 months.  Please call our office 2 months in advance to schedule this appointment.  You may see Jenean Lindau, MD or another member of our [place] Provider Team in [place]: Jenne Campus, MD . Shirlee More, MD  Any Other Special Instructions Will Be Listed Below (If Applicable).

## 2019-05-21 NOTE — Progress Notes (Signed)
Cardiology Office Note:    Date:  05/21/2019   ID:  [name], DOB 20-Jan-1941, MRN [id number]  PCP:  Raina Mina., MD  Cardiologist:  Jenean Lindau, MD   Referring MD: Raina Mina., MD    ASSESSMENT:    1. Chronic combined systolic and diastolic congestive heart failure ([place])   2. Coronary artery disease involving native coronary artery of native heart without angina pectoris   3. Essential hypertension   4. Other persistent atrial fibrillation   5. Mixed hyperlipidemia   6. Type 2 diabetes mellitus without complication, with long-term current use of insulin (HCC)    PLAN:    In order of problems listed above:  1. Coronary artery disease: Secondary prevention stressed with the patient.  Importance of compliance with diet and medication stressed and he vocalized understanding. Essential hypertension: His blood pressure is stable and diet was discussed Mixed dyslipidemia: Managed by primary care physician and the patient is on statin therapy. Atrial fibrillation patient is on warfarin and this is monitored by primary care. Patient will be seen in follow-up appointment in 3 months or earlier if the patient has any concerns    Medication Adjustments/Labs and Tests Ordered: Current medicines are reviewed at length with the patient today.  Concerns regarding medicines are outlined above.  No orders of the defined types were placed in this encounter.  No orders of the defined types were placed in this encounter.    Chief Complaint  Patient presents with  . Hospitalization Follow-up     History of Present Illness:    Marc [name] is a 78 y.o. male.  Patient has past medical history of coronary artery disease, essential hypertension, dyslipidemia and diabetes mellitus.  He denies any problems at this time and takes care of activities of daily living.  No chest pain orthopnea or PND.  The patient mentions to me that he went to [facility] and did coronary  angiography which was unremarkable.  I reviewed those records extensively.  Subsequently he was released home and is doing fine.  No chest pain orthopnea or PND.  At the time of my evaluation, the patient is alert awake oriented and in no distress.  Past Medical History:  Diagnosis Date  . AAA (abdominal aortic aneurysm) (HCC)    small. An abdominal ultrasound in 2011 showed no aneurysm  . ACS (acute coronary syndrome) ([place])   . Arthritis   . Atrial fibrillation ([place])    permanent  . Coronary artery disease   . Diabetes mellitus without complication ([place])   . Hyperlipidemia   . Hypertension   . Mitral regurgitation    with MVP. Mild to moderate  . Myocardial infarction ([place])   . Renal artery stenosis ([place])   . Renal artery stenosis in 1 of 2 vessels ([place]) 2006   subtotal occlusion of right renal artery. Treated medically.   . Sexual dysfunction     Past Surgical History:  Procedure Laterality Date  . CARDIAC CATHETERIZATION  2006   showed an occluded Left circumflex with collaterals as well as 60% mid right coronary artery stenosis  . CARDIAC CATHETERIZATION  02/2011   LM: 20%, LAD: calcified 20%, LCX: occluded distal AV groove supplying a small area, RCA: calcified with 50-60% proximal-mid disease  . CIRCUMCISION    . FOOT SURGERY Right   . IR ANGIO INTRA EXTRACRAN SEL COM CAROTID INNOMINATE BILAT MOD SED  04/14/2018  . IR ANGIO VERTEBRAL SEL SUBCLAVIAN INNOMINATE  UNI R MOD SED  04/14/2018  . IR ANGIO VERTEBRAL SEL VERTEBRAL UNI L MOD SED  04/14/2018  . LEFT HEART CATH AND CORONARY ANGIOGRAPHY N/A 01/04/2018   Procedure: LEFT HEART CATH AND CORONARY ANGIOGRAPHY;  Surgeon: Wellington Hampshire, MD;  Location: [facility];  Service: Cardiovascular;  Laterality: N/A;  . LEFT HEART CATH AND CORONARY ANGIOGRAPHY N/A 05/08/2019   Procedure: LEFT HEART CATH AND CORONARY ANGIOGRAPHY;  Surgeon: Troy Sine, MD;  Location: [facility];  Service: Cardiovascular;  Laterality: N/A;     Current Medications: Current Meds  Medication Sig  . carvedilol (COREG) 25 MG tablet TAKE 1 TABLET BY MOUTH TWICE DAILY WITH A MEAL (Patient taking differently: Take 25 mg by mouth 2 (two) times daily with a meal. )  . Cholecalciferol 20 MCG (800 UNIT) TABS Take 1 tablet by mouth daily.  . Coenzyme Q10 (COQ10) 100 MG CAPS Take 100 mg by mouth daily.  Marland Kitchen diltiazem (CARDIZEM CD) 180 MG 24 hr capsule Take 1 capsule (180 mg total) by mouth daily.  . furosemide (LASIX) 20 MG tablet Take 1 tablet by mouth every other day.  Marland Kitchen glipiZIDE (GLUCOTROL XL) 5 MG 24 hr tablet Take 5 mg by mouth daily.   . Insulin Glargine (LANTUS SOLOSTAR) 100 UNIT/ML Solostar Pen Inject 10 Units into the skin at bedtime.   . Insulin Pen Needle (NOVOFINE) 32G X 6 MM MISC USE AS DIRECTED.  Marland Kitchen isosorbide mononitrate (IMDUR) 30 MG 24 hr tablet Take 0.5 tablets (15 mg total) by mouth daily. (Patient taking differently: Take 15 mg by mouth at bedtime. )  . nitroGLYCERIN (NITROSTAT) 0.4 MG SL tablet Place 1 tablet (0.4 mg total) under the tongue every 5 (five) minutes as needed for chest pain.  . rosuvastatin (CRESTOR) 40 MG tablet Take 1 tablet (40 mg total) by mouth daily at 6 PM.  . sertraline (ZOLOFT) 25 MG tablet Take 25 mg by mouth daily.   . tamsulosin (FLOMAX) 0.4 MG CAPS capsule Take 0.4 mg by mouth at bedtime.   . traMADol-acetaminophen (ULTRACET) 37.5-325 MG per tablet Take 1 tablet by mouth 2 (two) times daily.   Marland Kitchen warfarin (COUMADIN) 5 MG tablet Take 5 mg by mouth daily.      Allergies:   Lisinopril, Ticagrelor, Cephalexin, Prednisone, and Simvastatin   Social History   Socioeconomic History  . Marital status: Widowed    Spouse name: Not on file  . Number of children: Not on file  . Years of education: Not on file  . Highest education level: Not on file  Occupational History  . 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 Financial resource strain: Not on file  . Food insecurity     Worry: Not on file    Inability: Not on file  . Transportation needs    Medical: Not on file    Non-medical: Not on file  Tobacco Use  . Smoking status: Former Smoker    Packs/day: 0.10    Years: 30.00    Pack years: 3.00    Types: Cigarettes    Quit date: 12/15/1983    Years since quitting: 35.4  . Smokeless tobacco: Former Systems developer    Types: [place] date: 09/07/1983  Substance and Sexual Activity  . Alcohol use: No  . Drug use: No  . Sexual activity: Not on file  Lifestyle  . Physical activity    Days per week: Not  on file    Minutes per session: Not on file  . Stress: Not on file  Relationships  . Social Herbalist on phone: Not on file    Gets together: Not on file    Attends religious service: Not on file    Active member of club or organization: Not on file    Attends meetings of clubs or organizations: Not on file    Relationship status: Not on file  Other Topics Concern  . Not on file  Social History Narrative  . Not on file     Family History: The patient's family history includes Heart disease in his sister.  ROS:   Please see the history of present illness.    All other systems reviewed and are negative.  EKGs/Labs/Other Studies Reviewed:    The following studies were reviewed today: Troy Sine, MD (Primary)    Procedures  LEFT HEART CATH AND CORONARY ANGIOGRAPHY  Conclusion    Ost LM lesion is 10% stenosed.  Ost Cx to Prox Cx lesion is 40% stenosed.  Prox RCA to Mid RCA lesion is 40% stenosed.  Dist RCA lesion is 100% stenosed.  Prox RCA lesion is 50% stenosed.  Ost 1st Mrg lesion is 50% stenosed.  Prox LAD to Mid LAD lesion is 30% stenosed.  There is moderate left ventricular systolic dysfunction.  LV end diastolic pressure is normal.   Significant multivessel coronary calcification with minimal smooth 10% narrowing in the left main, 30% proximal LAD stenosis, 40% ostial circumflex stenosis with 50% ostial narrowing  in a very proximal atrial branch, and previously noted significant calcification of the RCA with 40% narrowing in the distal ostial stent, 40% mid stenosis and old total occlusion of the RCA at the acute margin.  There are extensive collaterals supplying the PDA and PLA vessels with supply the inferior and inferolateral wall.  Moderate LV dysfunction with EF estimate in the 35 to 40% range with inferior hypocontractility and possible anterolateral hypocontractility.  LVEDP 10 mm.  RECOMMENDATION: Medical therapy with rate control of his atrial fibrillation and for his CAD..  The patient will be restarted on warfarin.  Lipid-lowering therapy if tolerated with target LDL less than 70.    IMPRESSIONS     1. The left ventricle has moderately reduced systolic function, with an ejection fraction of 35-40%. The cavity size was mildly dilated. There is mildly increased left ventricular wall thickness. Left ventricular diastolic function could not be  evaluated secondary to atrial fibrillation. Left ventrical global hypokinesis without regional wall motion abnormalities.  2. The right ventricle has normal systolic function. The cavity was normal. There is no increase in right ventricular wall thickness. Right ventricular systolic pressure is normal with an estimated pressure of 27.2 mmHg.  3. Left atrial size was severely dilated.  4. Right atrial size was moderately dilated.  5. 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 Mild thickening of the mitral valve leaflet. Mitral valve regurgitation is mild to moderate by color flow Doppler. The MR jet is centrally-directed.  7. The aorta is normal unless otherwise noted.  8. The inferior vena cava was normal in size with <50% respiratory variability.  9. No intracardiac thrombi or masses were visualized. 10. There is right bowing of the interatrial septum, suggestive of elevated left atrial pressure.   Recent Labs: 05/06/2019:  Magnesium 1.8 05/07/2019: TSH 4.681 05/08/2019: BUN 26; Creatinine, Ser 1.42; Hemoglobin 13.9; Platelets 125; Potassium 4.1; Sodium  142  Recent Lipid Panel    Component Value Date/Time   CHOL 206 (H) 05/06/2019 0637   TRIG 92 05/06/2019 0637   HDL 29 (L) 05/06/2019 0637   CHOLHDL 7.1 05/06/2019 0637   VLDL 18 05/06/2019 0637   LDLCALC 159 (H) 05/06/2019 0637    Physical Exam:    VS:  BP 132/70 (BP Location: Left Arm, Patient Position: Sitting, Cuff Size: Normal)   Pulse 68   Ht 6' (1.829 m)   Wt 192 lb (87.1 kg)   SpO2 97%   BMI 26.04 kg/m     Wt Readings from Last 3 Encounters:  05/21/19 192 lb (87.1 kg)  05/08/19 183 lb 6.8 oz (83.2 kg)  02/13/19 190 lb (86.2 kg)     GEN: Patient is in no acute distress HEENT: Normal NECK: No JVD; No carotid bruits LYMPHATICS: No lymphadenopathy CARDIAC: Hear sounds regular, 2/6 systolic murmur at the apex. RESPIRATORY:  Clear to auscultation without rales, wheezing or rhonchi  ABDOMEN: Soft, non-tender, non-distended MUSCULOSKELETAL:  No edema; No deformity  SKIN: Warm and dry NEUROLOGIC:  Alert and oriented x 3 PSYCHIATRIC:  Normal affect   Signed, Jenean Lindau, MD  05/21/2019 11:29 AM    [place]

## 2019-07-06 ENCOUNTER — Encounter: Payer: Self-pay | Admitting: General Practice

## 2019-07-18 ENCOUNTER — Other Ambulatory Visit: Payer: Self-pay | Admitting: Internal Medicine

## 2019-07-27 ENCOUNTER — Encounter: Payer: Self-pay | Admitting: Gastroenterology

## 2019-08-14 ENCOUNTER — Ambulatory Visit: Payer: Medicare Other | Admitting: Gastroenterology

## 2019-08-14 ENCOUNTER — Other Ambulatory Visit: Payer: Self-pay

## 2019-08-14 ENCOUNTER — Encounter: Payer: Self-pay | Admitting: Gastroenterology

## 2019-08-14 VITALS — BP 134/62 | HR 64 | Temp 97.8°F | Ht 72.0 in | Wt 190.4 lb

## 2019-08-14 DIAGNOSIS — R140 Abdominal distension (gaseous): Secondary | ICD-10-CM

## 2019-08-14 DIAGNOSIS — R1030 Lower abdominal pain, unspecified: Secondary | ICD-10-CM | POA: Diagnosis not present

## 2019-08-14 MED ORDER — PANTOPRAZOLE SODIUM 20 MG PO TBEC: 20.0000 mg | DELAYED_RELEASE_TABLET | Freq: Every day | ORAL | 0 refills | Status: AC

## 2019-08-14 NOTE — Patient Instructions (Addendum)
If you are age 77 or older, your body mass index should be between 23-30. Your Body mass index is 25.82 kg/m. If this is out of the aforementioned range listed, please consider follow up with your Primary Care Provider.  If you are age 2 or younger, your body mass index should be between 19-25. Your Body mass index is 25.82 kg/m. If this is out of the aformentioned range listed, please consider follow up with your Primary Care Provider.   We have sent the following medications to your pharmacy for you to pick up at your convenience: Protonix   Please go to the lab at [facility] ([place] You will need to go to level "B", you do not need an appointment for this. Hours available are 7:30 am - 4:30 pm. This will need to be done before your CT Scan or your CT Scan will have to be scheduled.   You have been scheduled [facility] [place] [place] [address] [facility]   You are scheduled on 08/24/19 at [place] should arrive 15 minutes prior to your appointment time for registration. Please follow the written instructions below on the day of your exam:  WARNING: IF YOU ARE ALLERGIC TO IODINE/X-RAY DYE, PLEASE NOTIFY RADIOLOGY IMMEDIATELY AT [phone]! YOU WILL BE GIVEN A 13 HOUR PREMEDICATION PREP.  1) Do not eat or drink anything after 5am (4 hours prior to your test) 2) You have been given 2 bottles of oral contrast to drink. The solution may taste better if refrigerated, but do NOT add ice or any other liquid to this solution. Shake well before drinking.    Drink 1 bottle of contrast @ 7am (2 hours prior to your exam)  Drink 1 bottle of contrast @ 8am (1 hour prior to your exam)  You may take any medications as prescribed with a small amount of water, if necessary. If you take any of the following medications: METFORMIN, GLUCOPHAGE, GLUCOVANCE, AVANDAMET, RIOMET, FORTAMET, [place] MET,  JANUMET, GLUMETZA or METAGLIP, you MAY be asked to HOLD this medication 48 hours AFTER the exam.  The purpose of you drinking the oral contrast is to aid in the visualization of your intestinal tract. The contrast solution may cause some diarrhea. Depending on your individual set of symptoms, you may also receive an intravenous injection of x-ray contrast/dye. Plan on being at [facility] for 30 minutes or longer, depending on the type of exam you are having performed.  This test typically takes 30-45 minutes to complete.  If you have any questions regarding your exam or if you need to reschedule, you may call the CT department at [phone] between the hours of 8:00 am and 5:00 pm, Monday-Friday.  ________________________________________________________________________  Follow up 12 weeks.  Thank you,  Dr. Jackquline Denmark

## 2019-08-14 NOTE — Progress Notes (Signed)
Chief Complaint: ?  Melena.  Referring Provider:  Raina Mina., MD      ASSESSMENT AND PLAN;   #1.  Lower abo pain with bloating, alt diarrhea and constipation.  #2Teressa Senter stools vs dark tarry stools.  Heme-negative.  Neg EGD/colon Dr Melina Copa 06/2018.  #3.  A. fib on Coumadin.  Has associated DM2, CAD, HTN, HLD, COPD, anxiety/depression.  Has borderline thrombocytopenia ?  Etiology.  No definite liver cirrhosis.  Plan: - Protonix 20mg  po qd x 4 weeks. - CT AP with PO/IV contrast. - CBC, CMP prior to CT.  - Records regarding recent EGD/colonoscopy from Dr. Carmie End office. - FU in 12 weeks.    HPI:    Marc [name] is a 78 y.o. male  Initially had dark tarry stools.  Found to have heme negative stools.  He is taking Coumadin for atrial fibrillation.  He had normal CBC on 05/16/2019 with hemoglobin 13.5, MCV 87.  His platelets were borderline low at 124K.  Had undergone EGD/colon by Dr. Melina Copa due to GI bleed 06/2018.  I do not have the records currently.  According to the notes it showed normal colonic mucosa with recommendations not to repeat routine colonoscopy.  At that time he was on Xarelto which was changed back to Coumadin.  He does have lower abdominal discomfort with alternating diarrhea and constipation.  Denies having any significant heartburn or regurgitation.  He has previous history of heartburn.  He was supposed to be on omeprazole which he did not take because it was expensive over-the-counter.  He did like pantoprazole in the past.  Denies having any weight loss or loss of appetite.  No over-the-counter nonsteroidals.  Denies use of Pepto-Bismol.  Had rectal examination which showed heme-negative stools.  He does admit that when he takes cholesterol medicines-he starts having more GI problems.  He has stopped taking Crestor.  Accompanied by his daughter.   Denies any use of alcohol. Past Medical History:  Diagnosis Date  . AAA (abdominal aortic  aneurysm) (HCC)    small. An abdominal ultrasound in 2011 showed no aneurysm  . ACS (acute coronary syndrome) ([place])   . Arthritis   . Atrial fibrillation ([place])    permanent  . Coronary artery disease   . Diabetes mellitus without complication ([place])   . Heart attack ([place])    x4   . Hyperlipidemia   . Hypertension   . Mitral regurgitation    with MVP. Mild to moderate  . Myocardial infarction ([place])   . Renal artery stenosis ([place])   . 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 Treated medically.   . Sexual dysfunction     Past Surgical History:  Procedure Laterality Date  . CARDIAC CATHETERIZATION  2006   showed an occluded Left circumflex with collaterals as well as 60% mid right coronary artery stenosis  . CARDIAC CATHETERIZATION  02/2011   LM: 20%, LAD: calcified 20%, LCX: occluded distal AV groove supplying a small area, RCA: calcified with 50-60% proximal-mid disease  . CIRCUMCISION    . COLONOSCOPY  2019   Dr Orlena Sheldon. Said he didn't have to come back  . ESOPHAGOGASTRODUODENOSCOPY     Dr Orlena Sheldon possibly 2019   . FOOT SURGERY Right   . IR ANGIO INTRA EXTRACRAN SEL COM CAROTID INNOMINATE BILAT MOD SED  04/14/2018  . IR ANGIO VERTEBRAL SEL SUBCLAVIAN INNOMINATE UNI R MOD SED  04/14/2018  . IR ANGIO VERTEBRAL SEL VERTEBRAL UNI L MOD SED  04/14/2018  . LEFT HEART CATH AND CORONARY ANGIOGRAPHY N/A 01/04/2018   Procedure: LEFT HEART CATH AND CORONARY ANGIOGRAPHY;  Surgeon: Wellington Hampshire, MD;  Location: [facility];  Service: Cardiovascular;  Laterality: N/A;  . LEFT HEART CATH AND CORONARY ANGIOGRAPHY N/A 05/08/2019   Procedure: LEFT HEART CATH AND CORONARY ANGIOGRAPHY;  Surgeon: Troy Sine, MD;  Location: [facility];  Service: Cardiovascular;  Laterality: N/A;    Family History  Problem Relation Age of Onset  . Heart disease Sister     Social History   Tobacco Use  . Smoking status: Former Smoker     Packs/day: 0.10    Years: 30.00    Pack years: 3.00    Types: Cigarettes    Quit date: 12/15/1983    Years since quitting: 35.6  . Smokeless tobacco: Former Systems developer    Types: Chew    Quit date: 09/07/1983  Substance Use Topics  . Alcohol use: No  . Drug use: No    Current Outpatient Medications  Medication Sig Dispense Refill  . carvedilol (COREG) 25 MG tablet TAKE 1 TABLET BY MOUTH TWICE DAILY WITH A MEAL (Patient taking differently: Take 25 mg by mouth 2 (two) times daily with a meal. ) 60 tablet 0  . Cholecalciferol 20 MCG (800 UNIT) TABS Take 1 tablet by mouth daily. 75 tablet 2  . Coenzyme Q10 (COQ10) 100 MG CAPS Take 100 mg by mouth daily.    Marland Kitchen diltiazem (CARDIZEM CD) 180 MG 24 hr capsule Take 1 capsule (180 mg total) by mouth daily. 30 capsule 3  . furosemide (LASIX) 20 MG tablet Take 1 tablet (20 mg total) by mouth every other day. 30 tablet 5  . glipiZIDE (GLUCOTROL XL) 5 MG 24 hr tablet Take 5 mg by mouth daily.     . Insulin Glargine (LANTUS SOLOSTAR) 100 UNIT/ML Solostar Pen Inject 10 Units into the skin at bedtime.     . Insulin Pen Needle (NOVOFINE) 32G X 6 MM MISC USE AS DIRECTED.    Marland Kitchen isosorbide mononitrate (IMDUR) 30 MG 24 hr tablet Take 0.5 tablets (15 mg total) by mouth daily. (Patient taking differently: Take 15 mg by mouth at bedtime. ) 30 tablet 0  . nitroGLYCERIN (NITROSTAT) 0.4 MG SL tablet Place 1 tablet (0.4 mg total) under the tongue every 5 (five) minutes as needed for chest pain. 25 tablet 2  . sertraline (ZOLOFT) 25 MG tablet Take 25 mg by mouth daily.     . tamsulosin (FLOMAX) 0.4 MG CAPS capsule Take 0.4 mg by mouth at bedtime.     . traMADol-acetaminophen (ULTRACET) 37.5-325 MG per tablet Take 1 tablet by mouth 2 (two) times daily.     Marland Kitchen warfarin (COUMADIN) 5 MG tablet Take 5 mg by mouth daily.     . rosuvastatin (CRESTOR) 40 MG tablet Take 1 tablet (40 mg total) by mouth daily at 6 PM. (Patient not taking: Reported on 08/14/2019) 90 tablet 0   No current  facility-administered medications for this visit.     Allergies  Allergen Reactions  . Lisinopril Shortness Of Breath       . Ticagrelor Shortness Of Breath and Other (See Comments)    * Brilinta* Pt short of breath  . Cephalexin Diarrhea and Nausea And Vomiting       . Prednisone Other (See Comments)    GI Issues  . Simvastatin  Other (See Comments)    Unsure    Review of Systems:  Constitutional: Denies fever, chills, diaphoresis, appetite change and fatigue.  HEENT: Denies photophobia, eye pain, redness, hearing loss, ear pain, congestion, sore throat, rhinorrhea, sneezing, mouth sores, neck pain, neck stiffness and tinnitus.   Respiratory: Denies SOB, DOE, cough, chest tightness,  and wheezing.   Cardiovascular: Denies chest pain, palpitations and leg swelling.  Genitourinary: Denies dysuria, urgency, frequency, hematuria, flank pain and difficulty urinating.  Musculoskeletal: Denies myalgias, back pain, joint swelling, arthralgias and gait problem.  Skin: No rash.  Neurological: Denies dizziness, seizures, syncope, weakness, light-headedness, numbness and headaches.  Hematological: Denies adenopathy. Easy bruising, personal or family bleeding history  Psychiatric/Behavioral: has anxiety or depression     Physical Exam:    BP 134/62   Pulse 64   Temp 97.8 F (36.6 C)   Ht 6' (1.829 m)   Wt 190 lb 6 oz (86.4 kg)   BMI 25.82 kg/m  Wt Readings from Last 3 Encounters:  08/14/19 190 lb 6 oz (86.4 kg)  05/21/19 192 lb (87.1 kg)  05/08/19 183 lb 6.8 oz (83.2 kg)   Constitutional:  Well-developed, in no acute distress. Psychiatric: Normal mood and affect. Behavior is normal. HEENT: Pupils normal.  Conjunctivae are normal. No scleral icterus. Neck supple.  Cardiovascular: Normal rate, regular rhythm. No edema Pulmonary/chest: Effort normal and breath sounds normal. No wheezing, rales or rhonchi. Abdominal: Soft, nondistended. Nontender. Bowel sounds active throughout.  There are no masses palpable. No hepatomegaly. Rectal:  defered Neurological: Alert and oriented to person place and time. Skin: Skin is warm and dry. No rashes noted.  Data Reviewed: I have personally reviewed following labs and imaging studies  CBC: CBC Latest Ref Rng & Units 05/08/2019 05/07/2019 05/06/2019  WBC 4.0 - 10.5 K/uL 6.0 6.6 6.1  Hemoglobin 13.0 - 17.0 g/dL 13.9 14.0 14.3  Hematocrit 39.0 - 52.0 % 41.7 42.5 42.0  Platelets 150 - 400 K/uL 125(L) 115(L) 123(L)    CMP: CMP Latest Ref Rng & Units 05/08/2019 05/07/2019 05/06/2019  Glucose 70 - 99 mg/dL 133(H) 130(H) 106(H)  BUN 8 - 23 mg/dL 26(H) 27(H) 23  Creatinine 0.61 - 1.24 mg/dL 1.42(H) 1.64(H) 1.51(H)  Sodium 135 - 145 mmol/L 142 142 143  Potassium 3.5 - 5.1 mmol/L 4.1 3.9 3.9  Chloride 98 - 111 mmol/L 111 110 109  CO2 22 - 32 mmol/L 23 23 21(L)  Calcium 8.9 - 10.3 mg/dL 9.4 9.6 9.7   Creatinine was 1.49 on 05/16/2019 (normal is up to 1.5) Discussed in detail with the patient patient's daughter.  Carmell Austria, MD 08/14/2019, 2:18 PM  Cc: Raina Mina., MD

## 2019-08-17 ENCOUNTER — Other Ambulatory Visit (INDEPENDENT_AMBULATORY_CARE_PROVIDER_SITE_OTHER): Payer: Medicare Other

## 2019-08-17 DIAGNOSIS — R1030 Lower abdominal pain, unspecified: Secondary | ICD-10-CM

## 2019-08-17 DIAGNOSIS — R140 Abdominal distension (gaseous): Secondary | ICD-10-CM

## 2019-08-17 LAB — COMPREHENSIVE METABOLIC PANEL
ALT: 13 U/L (ref 0–53)
AST: 17 U/L (ref 0–37)
Albumin: 4.1 g/dL (ref 3.5–5.2)
Alkaline Phosphatase: 48 U/L (ref 39–117)
BUN: 35 mg/dL — ABNORMAL HIGH (ref 6–23)
CO2: 24 mEq/L (ref 19–32)
Calcium: 9.7 mg/dL (ref 8.4–10.5)
Chloride: 109 mEq/L (ref 96–112)
Creatinine, Ser: 1.91 mg/dL — ABNORMAL HIGH (ref 0.40–1.50)
GFR: 34.28 mL/min — ABNORMAL LOW (ref >60.00)
Glucose, Bld: 199 mg/dL — ABNORMAL HIGH (ref 70–99)
Potassium: 4.5 mEq/L (ref 3.5–5.1)
Sodium: 140 mEq/L (ref 135–145)
Total Bilirubin: 0.5 mg/dL (ref 0.2–1.2)
Total Protein: 6.9 g/dL (ref 6.0–8.3)

## 2019-08-17 LAB — CBC WITH DIFFERENTIAL/PLATELET
Basophils Absolute: 0.1 10*3/uL (ref 0.0–0.1)
Basophils Relative: 1.3 % (ref 0.0–3.0)
Eosinophils Absolute: 0.9 10*3/uL — ABNORMAL HIGH (ref 0.0–0.7)
Eosinophils Relative: 15.9 % — ABNORMAL HIGH (ref 0.0–5.0)
HCT: 36.3 % — ABNORMAL LOW (ref 39.0–52.0)
Hemoglobin: 12.1 g/dL — ABNORMAL LOW (ref 13.0–17.0)
Lymphocytes Relative: 16.2 % (ref 12.0–46.0)
Lymphs Abs: 0.9 10*3/uL (ref 0.7–4.0)
MCHC: 33.2 g/dL (ref 30.0–36.0)
MCV: 90.4 fl (ref 78.0–100.0)
Monocytes Absolute: 0.4 10*3/uL (ref 0.1–1.0)
Monocytes Relative: 6.5 % (ref 3.0–12.0)
Neutro Abs: 3.4 10*3/uL (ref 1.4–7.7)
Neutrophils Relative %: 60.1 % (ref 43.0–77.0)
Platelets: 137 10*3/uL — ABNORMAL LOW (ref 150.0–400.0)
RBC: 4.02 Mil/uL — ABNORMAL LOW (ref 4.22–5.81)
RDW: 15.9 % — ABNORMAL HIGH (ref 11.5–15.5)
WBC: 5.7 10*3/uL (ref 4.0–10.5)

## 2019-08-20 ENCOUNTER — Encounter: Payer: Self-pay | Admitting: Cardiology

## 2019-08-20 ENCOUNTER — Other Ambulatory Visit: Payer: Self-pay

## 2019-08-20 ENCOUNTER — Ambulatory Visit (INDEPENDENT_AMBULATORY_CARE_PROVIDER_SITE_OTHER): Payer: Medicare Other | Admitting: Cardiology

## 2019-08-20 VITALS — BP 160/76 | HR 63 | Ht 72.0 in | Wt 194.0 lb

## 2019-08-20 DIAGNOSIS — E119 Type 2 diabetes mellitus without complications: Secondary | ICD-10-CM

## 2019-08-20 DIAGNOSIS — I4819 Other persistent atrial fibrillation: Secondary | ICD-10-CM

## 2019-08-20 DIAGNOSIS — I2510 Atherosclerotic heart disease of native coronary artery without angina pectoris: Secondary | ICD-10-CM

## 2019-08-20 DIAGNOSIS — I10 Essential (primary) hypertension: Secondary | ICD-10-CM | POA: Diagnosis not present

## 2019-08-20 DIAGNOSIS — E782 Mixed hyperlipidemia: Secondary | ICD-10-CM

## 2019-08-20 DIAGNOSIS — I340 Nonrheumatic mitral (valve) insufficiency: Secondary | ICD-10-CM

## 2019-08-20 DIAGNOSIS — Z794 Long term (current) use of insulin: Secondary | ICD-10-CM

## 2019-08-20 NOTE — Patient Instructions (Signed)
Medication Instructions:  Your physician recommends that you continue on your current medications as directed. Please refer to the Current Medication list given to you today.  *If you need a refill on your cardiac medications before your next appointment, please call your pharmacy*  Lab Work: None Ordered If you have labs (blood work) drawn today and your tests are completely normal, you will receive your results only by: Marland Kitchen MyChart Message (if you have MyChart) OR . A paper copy in the mail If you have any lab test that is abnormal or we need to change your treatment, we will call you to review the results.  Testing/Procedures: None Ordered   Follow-Up: At [facility], you and your health needs are our priority.  As part of our continuing mission to provide you with exceptional heart care, we have created designated Provider Care Teams.  These Care Teams include your primary Cardiologist (physician) and Advanced Practice Providers (APPs -  Physician Assistants and Nurse Practitioners) who all work together to provide you with the care you need, when you need it.  Your next appointment:   6 month(s)  The format for your next appointment:   In Person  Provider:   Jyl Heinz, MD

## 2019-08-20 NOTE — Progress Notes (Signed)
Cardiology Office Note:    Date:  08/20/2019   ID:  Marc [name], DOB Sep 15, 1940, MRN [id number]  PCP:  Raina Mina., MD  Cardiologist:  Jenean Lindau, MD   Referring MD: Raina Mina., MD    ASSESSMENT:    1. Mixed hyperlipidemia   2. Mitral valve insufficiency, unspecified etiology   3. Essential hypertension   4. Coronary artery disease involving native coronary artery of native heart without angina pectoris   5. Persistent atrial fibrillation ([place])   6. Type 2 diabetes mellitus without complication, with long-term current use of insulin (HCC)    PLAN:    In order of problems listed above:  1. Atrial fibrillation:I discussed with the patient atrial fibrillation, disease process. Management and therapy including rate and rhythm control, anticoagulation benefits and potential risks were discussed extensively with the patient. Patient had multiple questions which were answered to patient's satisfaction.  Medications and anticoagulation managed by primary care providers. 2. Essential hypertension: He mentions to me that he has an element of whitecoat hypertension.  Blood pressures are stable at home and he mentioned with the numbers. 3. Mixed dyslipidemia: I reviewed the numbers.  He also had blood work recently.  He is vehemently against lipid-lowering medications and risks explained and he is willing to take those risks.  I respect his wishes. 4. Coronary artery disease: Stable at this time importance of regular exercise stressed and he promises to do better.  Sublingual nitroglycerin prescription was sent, its protocol and 911 protocol explained and the patient vocalized understanding questions were answered to the patient's satisfaction 5. Patient will be seen in follow-up appointment in 6 months or earlier if the patient has any concerns    Medication Adjustments/Labs and Tests Ordered: Current medicines are reviewed at length with the patient today.  Concerns  regarding medicines are outlined above.  No orders of the defined types were placed in this encounter.  No orders of the defined types were placed in this encounter.    Chief Complaint  Patient presents with  . Follow-up     History of Present Illness:    Marc [name] is a 78 y.o. male.  Patient has past medical history of coronary artery disease, atrial fibrillation, diabetes mellitus dyslipidemia and essential hypertension.  He denies any problems at this time and takes care of activities of daily living.  No chest pain orthopnea or PND.  At the time of my evaluation, the patient is alert awake oriented and in no distress.  Past Medical History:  Diagnosis Date  . AAA (abdominal aortic aneurysm) (HCC)    small. An abdominal ultrasound in 2011 showed no aneurysm  . ACS (acute coronary syndrome) ([place])   . Arthritis   . Atrial fibrillation ([place])    permanent  . Coronary artery disease   . Diabetes mellitus without complication ([place])   . Heart attack ([place])    x4   . Hyperlipidemia   . Hypertension   . Mitral regurgitation    with MVP. Mild to moderate  . Myocardial infarction ([place])   . Renal artery stenosis ([place])   . Renal artery stenosis in 1 of 2 vessels ([place]) 2006   subtotal occlusion of right renal artery. Treated medically.   . Sexual dysfunction     Past Surgical History:  Procedure Laterality Date  . CARDIAC CATHETERIZATION  2006   showed an occluded Left circumflex with collaterals as well as 60% mid right coronary artery stenosis  .  CARDIAC CATHETERIZATION  02/2011   LM: 20%, LAD: calcified 20%, LCX: occluded distal AV groove supplying a small area, RCA: calcified with 50-60% proximal-mid disease  . CIRCUMCISION    . COLONOSCOPY  2019   Dr Orlena Sheldon. Said he didn't have to come back  . ESOPHAGOGASTRODUODENOSCOPY     Dr Orlena Sheldon possibly 2019   . FOOT SURGERY Right   . IR ANGIO INTRA EXTRACRAN SEL COM CAROTID INNOMINATE BILAT MOD SED  04/14/2018  . IR ANGIO  VERTEBRAL SEL SUBCLAVIAN INNOMINATE UNI R MOD SED  04/14/2018  . IR ANGIO VERTEBRAL SEL VERTEBRAL UNI L MOD SED  04/14/2018  . LEFT HEART CATH AND CORONARY ANGIOGRAPHY N/A 01/04/2018   Procedure: LEFT HEART CATH AND CORONARY ANGIOGRAPHY;  Surgeon: Wellington Hampshire, MD;  Location: [facility];  Service: Cardiovascular;  Laterality: N/A;  . LEFT HEART CATH AND CORONARY ANGIOGRAPHY N/A 05/08/2019   Procedure: LEFT HEART CATH AND CORONARY ANGIOGRAPHY;  Surgeon: Troy Sine, MD;  Location: [facility];  Service: Cardiovascular;  Laterality: N/A;    Current Medications: Current Meds  Medication Sig  . carvedilol (COREG) 25 MG tablet TAKE 1 TABLET BY MOUTH TWICE DAILY WITH A MEAL (Patient taking differently: Take 25 mg by mouth 2 (two) times daily with a meal. )  . Cholecalciferol 20 MCG (800 UNIT) TABS Take 1 tablet by mouth daily.  . Coenzyme Q10 (COQ10) 100 MG CAPS Take 100 mg by mouth daily.  Marland Kitchen diltiazem (CARDIZEM CD) 180 MG 24 hr capsule Take 1 capsule (180 mg total) by mouth daily.  . furosemide (LASIX) 20 MG tablet Take 1 tablet (20 mg total) by mouth every other day.  Marland Kitchen glipiZIDE (GLUCOTROL XL) 5 MG 24 hr tablet Take 5 mg by mouth daily.   . Insulin Glargine (LANTUS SOLOSTAR) 100 UNIT/ML Solostar Pen Inject 10 Units into the skin at bedtime.   . Insulin Pen Needle (NOVOFINE) 32G X 6 MM MISC USE AS DIRECTED.  Marland Kitchen isosorbide mononitrate (IMDUR) 30 MG 24 hr tablet Take 0.5 tablets (15 mg total) by mouth daily. (Patient taking differently: Take 15 mg by mouth at bedtime. )  . nitroGLYCERIN (NITROSTAT) 0.4 MG SL tablet Place 1 tablet (0.4 mg total) under the tongue every 5 (five) minutes as needed for chest pain.  . pantoprazole (PROTONIX) 20 MG tablet Take 1 tablet (20 mg total) by mouth daily.  . sertraline (ZOLOFT) 25 MG tablet Take 25 mg by mouth daily.   . tamsulosin (FLOMAX) 0.4 MG CAPS capsule Take 0.4 mg by mouth at bedtime.   . traMADol-acetaminophen (ULTRACET) 37.5-325 MG per  tablet Take 1 tablet by mouth 2 (two) times daily.   Marland Kitchen warfarin (COUMADIN) 5 MG tablet Take 5 mg by mouth daily.      Allergies:   Lisinopril, Ticagrelor, Cephalexin, Prednisone, Simvastatin, and Statins   Social History   Socioeconomic History  . Marital status: Widowed    Spouse name: Not on file  . Number of children: Not on file  . Years of education: Not on file  . Highest education level: Not on file  Occupational History  . Occupation: retired Research scientist (physical sciences): [employer]  Tobacco Use  . Smoking status: Former Smoker    Packs/day: 0.10    Years: 30.00    Pack years: 3.00    Types: Cigarettes    Quit date: 12/15/1983    Years since quitting: 35.7  . Smokeless tobacco: Former Systems developer    Types:  Sarina Ser    Quit date: 09/07/1983  Substance and Sexual Activity  . Alcohol use: No  . Drug use: No  . Sexual activity: Not on file  Other Topics Concern  . Not on file  Social History Narrative  . Not on file   Social Determinants of Health   Financial Resource Strain:   . Difficulty of Paying Living Expenses: Not on file  Food Insecurity:   . Worried About Charity fundraiser in the Last Year: Not on file  . Ran Out of Food in the Last Year: Not on file  Transportation Needs:   . Lack of Transportation (Medical): Not on file  . Lack of Transportation (Non-Medical): Not on file  Physical Activity:   . Days of Exercise per Week: Not on file  . Minutes of Exercise per Session: Not on file  Stress:   . Feeling of Stress : Not on file  Social Connections:   . Frequency of Communication with Friends and Family: Not on file  . Frequency of Social Gatherings with Friends and Family: Not on file  . Attends Religious Services: Not on file  . Active Member of Clubs or Organizations: Not on file  . Attends Archivist Meetings: Not on file  . Marital Status: Not on file     Family History: The patient's family history includes Heart disease in his sister.  ROS:     Please see the history of present illness.    All other systems reviewed and are negative.  EKGs/Labs/Other Studies Reviewed:    The following studies were reviewed today: I discussed my findings with patient at extensive length   Recent Labs: 05/06/2019: Magnesium 1.8 05/07/2019: TSH 4.681 08/17/2019: ALT 13; BUN 35; Creatinine, Ser 1.91; Hemoglobin 12.1; Platelets 137.0; Potassium 4.5; Sodium 140  Recent Lipid Panel    Component Value Date/Time   CHOL 206 (H) 05/06/2019 0637   TRIG 92 05/06/2019 0637   HDL 29 (L) 05/06/2019 0637   CHOLHDL 7.1 05/06/2019 0637   VLDL 18 05/06/2019 0637   LDLCALC 159 (H) 05/06/2019 0637    Physical Exam:    VS:  BP (!) 160/76 (BP Location: Left Arm, Patient Position: Sitting, Cuff Size: Normal)   Pulse 63   Ht 6' (1.829 m)   Wt 194 lb (88 kg)   SpO2 98%   BMI 26.31 kg/m     Wt Readings from Last 3 Encounters:  08/20/19 194 lb (88 kg)  08/14/19 190 lb 6 oz (86.4 kg)  05/21/19 192 lb (87.1 kg)     GEN: Patient is in no acute distress HEENT: Normal NECK: No JVD; No carotid bruits LYMPHATICS: No lymphadenopathy CARDIAC: Hear sounds regular, 2/6 systolic murmur at the apex. RESPIRATORY:  Clear to auscultation without rales, wheezing or rhonchi  ABDOMEN: Soft, non-tender, non-distended MUSCULOSKELETAL:  No edema; No deformity  SKIN: Warm and dry NEUROLOGIC:  Alert and oriented x 3 PSYCHIATRIC:  Normal affect   Signed, Jenean Lindau, MD  08/20/2019 11:27 AM    [place]

## 2019-08-21 ENCOUNTER — Encounter (HOSPITAL_COMMUNITY): Payer: Medicare Other

## 2019-08-21 ENCOUNTER — Ambulatory Visit: Payer: Medicare Other | Admitting: Vascular Surgery

## 2019-08-24 ENCOUNTER — Ambulatory Visit (HOSPITAL_BASED_OUTPATIENT_CLINIC_OR_DEPARTMENT_OTHER): Admission: RE | Admit: 2019-08-24 | Payer: Medicare Other | Source: Ambulatory Visit

## 2019-08-24 ENCOUNTER — Other Ambulatory Visit: Payer: Self-pay

## 2019-08-24 ENCOUNTER — Ambulatory Visit (HOSPITAL_BASED_OUTPATIENT_CLINIC_OR_DEPARTMENT_OTHER)
Admission: RE | Admit: 2019-08-24 | Discharge: 2019-08-24 | Disposition: A | Discharge status: Home / Self Care | Payer: Medicare Other | Source: Ambulatory Visit | Attending: Gastroenterology | Admitting: Gastroenterology

## 2019-08-24 DIAGNOSIS — R1030 Lower abdominal pain, unspecified: Secondary | ICD-10-CM | POA: Diagnosis present

## 2019-08-24 DIAGNOSIS — R140 Abdominal distension (gaseous): Secondary | ICD-10-CM

## 2019-09-07 ENCOUNTER — Inpatient Hospital Stay (HOSPITAL_COMMUNITY): Payer: Medicare Other

## 2019-09-07 ENCOUNTER — Other Ambulatory Visit: Payer: Self-pay

## 2019-09-07 ENCOUNTER — Inpatient Hospital Stay (HOSPITAL_COMMUNITY)
Admission: AD | Admit: 2019-09-07 | Discharge: 2019-10-08 | DRG: 177 | Disposition: E | Payer: Medicare Other | Source: Other Acute Inpatient Hospital | Attending: Family Medicine | Admitting: Family Medicine

## 2019-09-07 ENCOUNTER — Encounter (HOSPITAL_COMMUNITY): Payer: Self-pay | Admitting: Family Medicine

## 2019-09-07 DIAGNOSIS — N400 Enlarged prostate without lower urinary tract symptoms: Secondary | ICD-10-CM | POA: Diagnosis present

## 2019-09-07 DIAGNOSIS — Z8249 Family history of ischemic heart disease and other diseases of the circulatory system: Secondary | ICD-10-CM

## 2019-09-07 DIAGNOSIS — I130 Hypertensive heart and chronic kidney disease with heart failure and stage 1 through stage 4 chronic kidney disease, or unspecified chronic kidney disease: Secondary | ICD-10-CM | POA: Diagnosis present

## 2019-09-07 DIAGNOSIS — Z66 Do not resuscitate: Secondary | ICD-10-CM | POA: Diagnosis present

## 2019-09-07 DIAGNOSIS — Z87891 Personal history of nicotine dependence: Secondary | ICD-10-CM

## 2019-09-07 DIAGNOSIS — Z794 Long term (current) use of insulin: Secondary | ICD-10-CM | POA: Diagnosis not present

## 2019-09-07 DIAGNOSIS — I2510 Atherosclerotic heart disease of native coronary artery without angina pectoris: Secondary | ICD-10-CM | POA: Diagnosis present

## 2019-09-07 DIAGNOSIS — U071 COVID-19: Secondary | ICD-10-CM | POA: Diagnosis present

## 2019-09-07 DIAGNOSIS — R7989 Other specified abnormal findings of blood chemistry: Secondary | ICD-10-CM | POA: Diagnosis present

## 2019-09-07 DIAGNOSIS — I4891 Unspecified atrial fibrillation: Secondary | ICD-10-CM | POA: Diagnosis present

## 2019-09-07 DIAGNOSIS — D6959 Other secondary thrombocytopenia: Secondary | ICD-10-CM | POA: Diagnosis present

## 2019-09-07 DIAGNOSIS — J9601 Acute respiratory failure with hypoxia: Secondary | ICD-10-CM | POA: Diagnosis present

## 2019-09-07 DIAGNOSIS — Z888 Allergy status to other drugs, medicaments and biological substances status: Secondary | ICD-10-CM | POA: Diagnosis not present

## 2019-09-07 DIAGNOSIS — I340 Nonrheumatic mitral (valve) insufficiency: Secondary | ICD-10-CM | POA: Diagnosis present

## 2019-09-07 DIAGNOSIS — N183 Chronic kidney disease, stage 3 unspecified: Secondary | ICD-10-CM | POA: Diagnosis present

## 2019-09-07 DIAGNOSIS — J449 Chronic obstructive pulmonary disease, unspecified: Secondary | ICD-10-CM | POA: Diagnosis present

## 2019-09-07 DIAGNOSIS — E785 Hyperlipidemia, unspecified: Secondary | ICD-10-CM | POA: Diagnosis present

## 2019-09-07 DIAGNOSIS — R778 Other specified abnormalities of plasma proteins: Secondary | ICD-10-CM | POA: Diagnosis present

## 2019-09-07 DIAGNOSIS — K219 Gastro-esophageal reflux disease without esophagitis: Secondary | ICD-10-CM | POA: Diagnosis present

## 2019-09-07 DIAGNOSIS — I248 Other forms of acute ischemic heart disease: Secondary | ICD-10-CM | POA: Diagnosis present

## 2019-09-07 DIAGNOSIS — E119 Type 2 diabetes mellitus without complications: Secondary | ICD-10-CM

## 2019-09-07 DIAGNOSIS — Z7901 Long term (current) use of anticoagulants: Secondary | ICD-10-CM | POA: Diagnosis not present

## 2019-09-07 DIAGNOSIS — E1122 Type 2 diabetes mellitus with diabetic chronic kidney disease: Secondary | ICD-10-CM | POA: Diagnosis present

## 2019-09-07 DIAGNOSIS — I252 Old myocardial infarction: Secondary | ICD-10-CM

## 2019-09-07 DIAGNOSIS — I10 Essential (primary) hypertension: Secondary | ICD-10-CM | POA: Diagnosis present

## 2019-09-07 DIAGNOSIS — N179 Acute kidney failure, unspecified: Secondary | ICD-10-CM | POA: Diagnosis present

## 2019-09-07 DIAGNOSIS — Z79899 Other long term (current) drug therapy: Secondary | ICD-10-CM | POA: Diagnosis not present

## 2019-09-07 DIAGNOSIS — M1990 Unspecified osteoarthritis, unspecified site: Secondary | ICD-10-CM | POA: Diagnosis present

## 2019-09-07 DIAGNOSIS — I5042 Chronic combined systolic (congestive) and diastolic (congestive) heart failure: Secondary | ICD-10-CM | POA: Diagnosis present

## 2019-09-07 LAB — GLUCOSE, CAPILLARY
Glucose-Capillary: 182 mg/dL — ABNORMAL HIGH (ref 70–99)
Glucose-Capillary: 348 mg/dL — ABNORMAL HIGH (ref 70–99)
Glucose-Capillary: 231 mg/dL — ABNORMAL HIGH (ref 70–99)

## 2019-09-07 LAB — D-DIMER, QUANTITATIVE: D-Dimer, Quant: >20 ug/mL-FEU — ABNORMAL HIGH (ref 0.00–0.50)

## 2019-09-07 LAB — CBC WITH DIFFERENTIAL/PLATELET
Abs Immature Granulocytes: 0.02 10*3/uL (ref 0.00–0.07)
Basophils Absolute: 0 10*3/uL (ref 0.0–0.1)
Basophils Relative: 0 %
Eosinophils Absolute: 0 10*3/uL (ref 0.0–0.5)
Eosinophils Relative: 0 %
HCT: 40.4 % (ref 39.0–52.0)
Hemoglobin: 13.3 g/dL (ref 13.0–17.0)
Immature Granulocytes: 1 %
Lymphocytes Relative: 8 %
Lymphs Abs: 0.4 10*3/uL — ABNORMAL LOW (ref 0.7–4.0)
MCH: 29.4 pg (ref 26.0–34.0)
MCHC: 32.9 g/dL (ref 30.0–36.0)
MCV: 89.4 fL (ref 80.0–100.0)
Monocytes Absolute: 0.2 10*3/uL (ref 0.1–1.0)
Monocytes Relative: 4 %
Neutro Abs: 3.8 10*3/uL (ref 1.7–7.7)
Neutrophils Relative %: 87 %
Platelets: 109 10*3/uL — ABNORMAL LOW (ref 150–400)
RBC: 4.52 MIL/uL (ref 4.22–5.81)
RDW: 14.4 % (ref 11.5–15.5)
WBC: 4.4 10*3/uL (ref 4.0–10.5)
nRBC: 0 % (ref 0.0–0.2)

## 2019-09-07 LAB — COMPREHENSIVE METABOLIC PANEL
ALT: 41 U/L (ref 0–44)
AST: 48 U/L — ABNORMAL HIGH (ref 15–41)
Albumin: 3.4 g/dL — ABNORMAL LOW (ref 3.5–5.0)
Alkaline Phosphatase: 52 U/L (ref 38–126)
Anion gap: 14 (ref 5–15)
BUN: 51 mg/dL — ABNORMAL HIGH (ref 8–23)
CO2: 19 mmol/L — ABNORMAL LOW (ref 22–32)
Calcium: 8.9 mg/dL (ref 8.9–10.3)
Chloride: 106 mmol/L (ref 98–111)
Creatinine, Ser: 2.05 mg/dL — ABNORMAL HIGH (ref 0.61–1.24)
GFR calc Af Amer: 35 mL/min — ABNORMAL LOW (ref >60)
GFR calc non Af Amer: 30 mL/min — ABNORMAL LOW (ref >60)
Glucose, Bld: 188 mg/dL — ABNORMAL HIGH (ref 70–99)
Potassium: 4.1 mmol/L (ref 3.5–5.1)
Sodium: 139 mmol/L (ref 135–145)
Total Bilirubin: 1 mg/dL (ref 0.3–1.2)
Total Protein: 6.9 g/dL (ref 6.5–8.1)

## 2019-09-07 LAB — LIPID PANEL
Cholesterol: 194 mg/dL (ref 0–200)
HDL: 22 mg/dL — ABNORMAL LOW (ref >40)
LDL Cholesterol: 140 mg/dL — ABNORMAL HIGH (ref 0–99)
Total CHOL/HDL Ratio: 8.8 RATIO
Triglycerides: 159 mg/dL — ABNORMAL HIGH (ref <150)
VLDL: 32 mg/dL (ref 0–40)

## 2019-09-07 LAB — C-REACTIVE PROTEIN: CRP: 21.9 mg/dL — ABNORMAL HIGH (ref <1.0)

## 2019-09-07 LAB — HIV ANTIBODY (ROUTINE TESTING W REFLEX): HIV Screen 4th Generation wRfx: NONREACTIVE

## 2019-09-07 LAB — TROPONIN I (HIGH SENSITIVITY)
Troponin I (High Sensitivity): 340 ng/L (ref <18)
Troponin I (High Sensitivity): 254 ng/L (ref <18)

## 2019-09-07 LAB — PROCALCITONIN: Procalcitonin: 0.5 ng/mL

## 2019-09-07 LAB — PROTIME-INR
INR: 3.6 — ABNORMAL HIGH (ref 0.8–1.2)
Prothrombin Time: 35.9 seconds — ABNORMAL HIGH (ref 11.4–15.2)

## 2019-09-07 LAB — FERRITIN: Ferritin: 649 ng/mL — ABNORMAL HIGH (ref 24–336)

## 2019-09-07 LAB — ABO/RH: ABO/RH(D): A POS

## 2019-09-07 LAB — PHOSPHORUS: Phosphorus: 3 mg/dL (ref 2.5–4.6)

## 2019-09-07 LAB — MAGNESIUM: Magnesium: 1.6 mg/dL — ABNORMAL LOW (ref 1.7–2.4)

## 2019-09-07 MED ORDER — INSULIN DETEMIR 100 UNIT/ML ~~LOC~~ SOLN
6.0000 [IU] | Freq: Two times a day (BID) | SUBCUTANEOUS | Status: DC
  Administered 2019-09-07 (×2): 6 [IU] via SUBCUTANEOUS
  Filled 2019-09-07 (×3): qty 0.06

## 2019-09-07 MED ORDER — CARVEDILOL 12.5 MG PO TABS
25.0000 mg | ORAL_TABLET | Freq: Two times a day (BID) | ORAL | Status: DC
  Administered 2019-09-07: 25 mg via ORAL
  Filled 2019-09-07: qty 2

## 2019-09-07 MED ORDER — DILTIAZEM HCL-DEXTROSE 125-5 MG/125ML-% IV SOLN (PREMIX)
5.0000 mg/h | INTRAVENOUS | Status: DC
  Administered 2019-09-07: 5 mg/h via INTRAVENOUS
  Administered 2019-09-07: 10 mg/h via INTRAVENOUS
  Administered 2019-09-07: 12.5 mg/h via INTRAVENOUS
  Filled 2019-09-07 (×2): qty 125

## 2019-09-07 MED ORDER — MAGNESIUM SULFATE IN D5W 1-5 GM/100ML-% IV SOLN
1.0000 g | Freq: Once | INTRAVENOUS | Status: AC
  Administered 2019-09-07: 1 g via INTRAVENOUS
  Filled 2019-09-07: qty 100

## 2019-09-07 MED ORDER — INSULIN ASPART 100 UNIT/ML ~~LOC~~ SOLN
0.0000 [IU] | SUBCUTANEOUS | Status: DC
  Administered 2019-09-07: 3 [IU] via SUBCUTANEOUS
  Administered 2019-09-07: 11 [IU] via SUBCUTANEOUS
  Administered 2019-09-07: 5 [IU] via SUBCUTANEOUS
  Administered 2019-09-08 (×2): 2 [IU] via SUBCUTANEOUS

## 2019-09-07 MED ORDER — METOPROLOL TARTRATE 5 MG/5ML IV SOLN
5.0000 mg | INTRAVENOUS | Status: DC | PRN
  Administered 2019-09-08 (×2): 5 mg via INTRAVENOUS
  Filled 2019-09-07 (×2): qty 5

## 2019-09-07 MED ORDER — SERTRALINE HCL 50 MG PO TABS
25.0000 mg | ORAL_TABLET | Freq: Every day | ORAL | Status: DC
  Administered 2019-09-07: 25 mg via ORAL
  Filled 2019-09-07: qty 1

## 2019-09-07 MED ORDER — INFLUENZA VAC A&B SA ADJ QUAD 0.5 ML IM PRSY
0.5000 mL | PREFILLED_SYRINGE | INTRAMUSCULAR | Status: DC
  Filled 2019-09-07: qty 0.5

## 2019-09-07 MED ORDER — SODIUM CHLORIDE 0.9 % IV SOLN
2.0000 g | INTRAVENOUS | Status: DC
  Administered 2019-09-07: 2 g via INTRAVENOUS
  Filled 2019-09-07: qty 20

## 2019-09-07 MED ORDER — TAMSULOSIN HCL 0.4 MG PO CAPS
0.4000 mg | ORAL_CAPSULE | Freq: Two times a day (BID) | ORAL | Status: DC
  Administered 2019-09-07: 0.4 mg via ORAL
  Filled 2019-09-07: qty 1

## 2019-09-07 MED ORDER — SODIUM CHLORIDE 0.9 % IV SOLN
100.0000 mg | Freq: Once | INTRAVENOUS | Status: AC
  Administered 2019-09-07: 100 mg via INTRAVENOUS
  Filled 2019-09-07: qty 20

## 2019-09-07 MED ORDER — METHYLPREDNISOLONE SODIUM SUCC 40 MG IJ SOLR
40.0000 mg | Freq: Two times a day (BID) | INTRAMUSCULAR | Status: DC
  Administered 2019-09-07 (×2): 40 mg via INTRAVENOUS
  Filled 2019-09-07 (×2): qty 1

## 2019-09-07 MED ORDER — ISOSORBIDE MONONITRATE 15 MG HALF TABLET
15.0000 mg | ORAL_TABLET | Freq: Every day | ORAL | Status: DC
  Administered 2019-09-07: 15 mg via ORAL
  Filled 2019-09-07: qty 1

## 2019-09-07 MED ORDER — CARVEDILOL 12.5 MG PO TABS: 25.0000 mg | ORAL_TABLET | Freq: Two times a day (BID) | ORAL | Status: DC

## 2019-09-07 MED ORDER — DILTIAZEM LOAD VIA INFUSION
10.0000 mg | Freq: Once | INTRAVENOUS | Status: DC
  Filled 2019-09-07: qty 10

## 2019-09-07 MED ORDER — SODIUM CHLORIDE 0.9 % IV SOLN: 100.0000 mg | Freq: Every day | INTRAVENOUS | Status: DC

## 2019-09-07 MED ORDER — COQ10 100 MG PO CAPS: 100.0000 mg | ORAL_CAPSULE | Freq: Every day | ORAL | Status: DC

## 2019-09-07 MED ORDER — TRAZODONE HCL 50 MG PO TABS
25.0000 mg | ORAL_TABLET | Freq: Every evening | ORAL | Status: DC | PRN
  Administered 2019-09-07: 25 mg via ORAL
  Filled 2019-09-07: qty 1

## 2019-09-07 MED ORDER — ATORVASTATIN CALCIUM 40 MG PO TABS: 40.0000 mg | ORAL_TABLET | Freq: Every day | ORAL | Status: DC

## 2019-09-07 MED ORDER — AZITHROMYCIN 250 MG PO TABS
500.0000 mg | ORAL_TABLET | Freq: Every day | ORAL | Status: DC
  Administered 2019-09-07: 500 mg via ORAL
  Filled 2019-09-07: qty 2

## 2019-09-07 MED ORDER — CHOLECALCIFEROL 20 MCG (800 UNIT) PO TABS: 1.0000 | ORAL_TABLET | Freq: Every day | ORAL | Status: DC

## 2019-09-07 MED ORDER — PANTOPRAZOLE SODIUM 20 MG PO TBEC
20.0000 mg | DELAYED_RELEASE_TABLET | Freq: Every day | ORAL | Status: DC
  Administered 2019-09-07: 20 mg via ORAL
  Filled 2019-09-07 (×2): qty 1

## 2019-09-07 NOTE — H&P (Signed)
History and Physical    MURLE BAKOWSKI [id number] DOB: 1941-06-16 DOA: 09/14/2019  PCP: Raina Mina., MD  Patient coming from: home, [facility] ED  I have personally briefly reviewed patient's old medical records in [place]  Chief Complaint: feeling sick  HPI: Marc [name] is Marc [name] 79 y.o. male with medical history significant of atrial fibrillation on warfarin, CAD, T2DM, HTN, HLD and multiple other medical problems presenting after feeling poorly for several days.    He notes worsening SOB and lethargy and weakness.  He's noted fevers and decreased appetite.  Describes general malaise and mild diarrhea.  Denies CP or SOB.  Denies N/V, change in taste or smell.  He was diagnosed with COVID on 12/28 at [facility] ED.  At that time he mainly complained of abdominal pain and diarrhea.  He represented at his daughters urging and was transferred to [facility] for further care.  Denies smoking or drinking.   ED Course: Labs, EKG, imaging.  Given steroids, diltiazem, lasix, toradol, and remdesivir.   Review of Systems: As per HPI otherwise 10 point review of systems negative.   Past Medical History:  Diagnosis Date  . AAA (abdominal aortic aneurysm) (HCC)    small. An abdominal ultrasound in 2011 showed no aneurysm  . ACS (acute coronary syndrome) ([place])   . Arthritis   . Atrial fibrillation ([place])    permanent  . Coronary artery disease   . Diabetes mellitus without complication ([place])   . Heart attack ([place])    x4   . Hyperlipidemia   . Hypertension   . Mitral regurgitation    with MVP. Mild to moderate  . Myocardial infarction ([place])   . Renal artery stenosis ([place])   . 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 Treated medically.   . Sexual dysfunction     Past Surgical History:  Procedure Laterality Date  . CARDIAC CATHETERIZATION  2006   showed an occluded Left circumflex with collaterals as well as 60% mid  right coronary artery stenosis  . CARDIAC CATHETERIZATION  02/2011   LM: 20%, LAD: calcified 20%, LCX: occluded distal AV groove supplying Dameir Gentzler small area, RCA: calcified with 50-60% proximal-mid disease  . CIRCUMCISION    . COLONOSCOPY  2019   Dr Orlena Sheldon. Said he didn't have to come back  . ESOPHAGOGASTRODUODENOSCOPY     Dr Orlena Sheldon possibly 2019   . FOOT SURGERY Right   . IR ANGIO INTRA EXTRACRAN SEL COM CAROTID INNOMINATE BILAT MOD SED  04/14/2018  . IR ANGIO VERTEBRAL SEL SUBCLAVIAN INNOMINATE UNI R MOD SED  04/14/2018  . IR ANGIO VERTEBRAL SEL VERTEBRAL UNI L MOD SED  04/14/2018  . LEFT HEART CATH AND CORONARY ANGIOGRAPHY N/Sharanda Shinault 01/04/2018   Procedure: LEFT HEART CATH AND CORONARY ANGIOGRAPHY;  Surgeon: Wellington Hampshire, MD;  Location: [place] [facility];  Service: Cardiovascular;  Laterality: N/Kimiah Hibner;  . LEFT HEART CATH AND CORONARY ANGIOGRAPHY N/Khadejah Son 05/08/2019   Procedure: LEFT HEART CATH AND CORONARY ANGIOGRAPHY;  Surgeon: Troy Sine, MD;  Location: [place] [facility];  Service: Cardiovascular;  Laterality: N/Jamal Haskin;     reports that he quit smoking about 35 years ago. His smoking use included cigarettes. He has Nichola Cieslinski 3.00 pack-year smoking history. He quit smokeless tobacco use about 36 years ago.  His smokeless tobacco use included chew. He reports that he does not drink alcohol or use drugs.  Allergies  Allergen Reactions  . Lisinopril Shortness Of Breath       . Ticagrelor Shortness Of Breath and Other (See Comments)    * Brilinta* Pt short of breath  . Cephalexin Diarrhea and Nausea And Vomiting       . Prednisone Other (See Comments)    GI Issues  . Simvastatin Other (See Comments)    Unsure  . Statins Diarrhea    Family History  Problem Relation Age of Onset  . Heart disease Sister    Prior to Admission medications   Medication Sig Start Date End Date Taking? Authorizing Provider  carvedilol (COREG) 25 MG tablet TAKE 1 TABLET BY MOUTH TWICE DAILY WITH Adriauna Campton MEAL Patient taking differently:  Take 25 mg by mouth 2 (two) times daily with Mary-Ann Pennella meal.  12/07/18   Revankar, Reita Cliche, MD  Cholecalciferol 20 MCG (800 UNIT) TABS Take 1 tablet by mouth daily. 05/08/19   Kathi Ludwig, MD  Coenzyme Q10 (COQ10) 100 MG CAPS Take 100 mg by mouth daily.    [provider]  diltiazem (CARDIZEM CD) 180 MG 24 hr capsule Take 1 capsule (180 mg total) by mouth daily. 05/09/19   Kathi Ludwig, MD  furosemide (LASIX) 20 MG tablet Take 1 tablet (20 mg total) by mouth every other day. 05/21/19   Revankar, Reita Cliche, MD  glipiZIDE (GLUCOTROL XL) 5 MG 24 hr tablet Take 5 mg by mouth daily.  07/18/17   [provider]  Insulin Glargine (LANTUS SOLOSTAR) 100 UNIT/ML Solostar Pen Inject 10 Units into the skin at bedtime.  01/10/17   [provider]  Insulin Pen Needle (NOVOFINE) 32G X 6 MM MISC USE AS DIRECTED. 10/20/16   [provider]  isosorbide mononitrate (IMDUR) 30 MG 24 hr tablet Take 0.5 tablets (15 mg total) by mouth daily. Patient taking differently: Take 15 mg by mouth at bedtime.  03/23/18   Revankar, Reita Cliche, MD  nitroGLYCERIN (NITROSTAT) 0.4 MG SL tablet Place 1 tablet (0.4 mg total) under the tongue every 5 (five) minutes as needed for chest pain. 01/06/18   Lyda Jester M, PA-C  pantoprazole (PROTONIX) 20 MG tablet Take 1 tablet (20 mg total) by mouth daily. 08/14/19   Jackquline Denmark, MD  sertraline (ZOLOFT) 25 MG tablet Take 25 mg by mouth daily.  01/09/18   [provider]  tamsulosin (FLOMAX) 0.4 MG CAPS capsule Take 0.4 mg by mouth at bedtime.  10/13/17   [provider]  traMADol-acetaminophen (ULTRACET) 37.5-325 MG per tablet Take 1 tablet by mouth 2 (two) times daily.     [provider]  warfarin (COUMADIN) 5 MG tablet Take 5 mg by mouth daily.  02/20/18   [provider]    Physical Exam: Vitals:   09/30/2019 1105 09/12/2019 1200 09/27/2019 1209 09/26/2019 1307  BP: (!) 129/94 (!) 154/84  (!) 151/82  Pulse: (!) 120 (!) 122  (!)  127  Resp: 16 15  (!) 22  Temp: 97.6 F (36.4 C)     TempSrc: Oral     SpO2: 97% 93%  93%  Weight:   86 kg   Height:   6' (1.829 m)     Constitutional: NAD, calm, comfortable Vitals:   10/03/2019 1105 09/18/2019 1200 09/13/2019 1209 10/05/2019 1307  BP: (!) 129/94 (!) 154/84  (!) 151/82  Pulse: (!) 120 (!) 122  (!) 127  Resp: 16 15  (!) 22  Temp: 97.6 F (36.4 C)     TempSrc: Oral  SpO2: 97% 93%  93%  Weight:   86 kg   Height:   6' (1.829 m)    Eyes: PERRL, lids and conjunctivae normal ENMT: Mucous membranes are moist. Posterior pharynx clear of any exudate or lesions.Normal dentition.  Neck: normal, supple, no masses, no thyromegaly Respiratory: clear to auscultation bilaterally, no wheezing, no crackles. Normal respiratory effort. No accessory muscle use.  Cardiovascular: Irregular tachy rate and rhythm, no murmurs / rubs / gallops. No extremity edema. 2+ pedal pulses.  Abdomen: no tenderness, no masses palpated. No hepatosplenomegaly. Bowel sounds positive.  Musculoskeletal: no clubbing / cyanosis. No joint deformity upper and lower extremities. Good ROM, no contractures. Normal muscle tone.  Skin: no rashes, lesions, ulcers. No induration Neurologic: CN 2-12 grossly intact. Sensation intact. Moving all extremities. Psychiatric: Normal judgment and insight. Alert and oriented x 3. Normal mood.   Labs on Admission: I have personally reviewed following labs and imaging studies  CBC: Recent Labs  Lab 09/20/2019 1200  WBC 4.4  NEUTROABS 3.8  HGB 13.3  HCT 40.4  MCV 89.4  PLT 0000000*   Basic Metabolic Panel: Recent Labs  Lab 09/10/2019 1200  NA 139  K 4.1  CL 106  CO2 19*  GLUCOSE 188*  BUN 51*  CREATININE 2.05*  CALCIUM 8.9  MG 1.6*  PHOS 3.0   GFR: Estimated Creatinine Clearance: 33.1 mL/min (Corbin Hott) (by C-G formula based on SCr of 2.05 mg/dL (H)). Liver Function Tests: Recent Labs  Lab 09/14/2019 1200  AST 48*  ALT 41  ALKPHOS 52  BILITOT 1.0  PROT 6.9  ALBUMIN  3.4*   No results for input(s): LIPASE, AMYLASE in the last 168 hours. No results for input(s): AMMONIA in the last 168 hours. Coagulation Profile: Recent Labs  Lab 09/24/2019 1200  INR 3.6*   Cardiac Enzymes: No results for input(s): CKTOTAL, CKMB, CKMBINDEX, TROPONINI in the last 168 hours. BNP (last 3 results) No results for input(s): PROBNP in the last 8760 hours. HbA1C: No results for input(s): HGBA1C in the last 72 hours. CBG: Recent Labs  Lab 09/24/2019 1149  GLUCAP 182*   Lipid Profile: No results for input(s): CHOL, HDL, LDLCALC, TRIG, CHOLHDL, LDLDIRECT in the last 72 hours. Thyroid Function Tests: No results for input(s): TSH, T4TOTAL, FREET4, T3FREE, THYROIDAB in the last 72 hours. Anemia Panel: Recent Labs    09/22/2019 1200  FERRITIN 649*   Urine analysis: No results found for: COLORURINE, APPEARANCEUR, LABSPEC, PHURINE, GLUCOSEU, HGBUR, BILIRUBINUR, KETONESUR, PROTEINUR, UROBILINOGEN, NITRITE, LEUKOCYTESUR  Radiological Exams on Admission: DG CHEST PORT 1 VIEW  Result Date: 09/15/2019 CLINICAL DATA:  COVID-19 virus infection EXAM: PORTABLE CHEST 1 VIEW COMPARISON:  Chest radiograph 09/06/2019 FINDINGS: Stable cardiomediastinal contours with enlarged heart size. There are bilateral infiltrates in the mid to lower lungs, which appear increased at the right lung base. No pneumothorax or large pleural effusion. IMPRESSION: Bilateral infiltrates which appear increased at the right lung base compared to prior. Electronically Signed   By: Audie Pinto M.D.   On: 09/23/2019 11:55    EKG: Independently reviewed. Atrial fibrillation  Assessment/Plan Active Problems:   COVID-19 virus infection  Acute Hypoxic Respiratory Failure Secondary To COVID 19 Virus Infection:  Satting well in the 90's on 4 L by [place] CXR with bilateral infiltrates (increased at R lung base) Continue steroids, remdesivir  Consider plasma and actemra if worsening  Procalcitonin is 0.5, follow  cultures Will start antibiotics, follow for deescalation I/O, daily weights Prone as able, I/O, OOB  COVID-19  Labs  Recent Labs    09/18/2019 1200  DDIMER >20.00*  FERRITIN 649*  CRP 21.9*    Lab Results  Component Value Date   SARSCOV2NAA NEGATIVE 05/05/2019   Elevated D Dimer: greater than 20.  Follow LE [place].  Continue warfarin.  Atrial Fibrillation with RVR  Supratherapeutic INR:  RVR with rates between the 110's and 130's.  On diltiazem gtt, though with decreased EF, should avoid ccb for rate control.  Continue PO carvedilol IV metoprolol prn Will work on getting off dilt gtt INR 3.6 today, warfarin dosing per pharmacy, will hold   HFrEF: EF 35-40% on 05/2019 echo.  Currently appears euvolemic.   Lasix currently on hold Continue carvedilol  Hx CAD  Elevated Troponin  Demand Ischemia: Suspect this is most likely demand ischemia in setting of RVR and COVID 19 virus infection.  He has no CP or other typical symptoms of ACS.  EKG with atrial fibrillation, similar to priors.  Continue coreg  T2DM:  Takes lantus and glipizide at home.  Will give levemir BID her with SSI.  Start tradjenta.  Adjust as needed.  Hypertension: imdur, carvedilol  BPH: continue flomax  GERD: continue PPI  AKI on CKD III: baseline creatinine 1.4-1.6.  Was 2.6 on 12/28 at [place], improved today to 2.05.  Will continue to follow.  Follow UA.    Hypomagnesemia: replace and follow  Thrombocytopenia: continue to monitor, possibly 2/2 acute viral illness  DVT prophylaxis: warfarin  Code Status: DNR  Family Communication: daughter over phone Disposition Plan: pending further improvement  Consults called: none  Admission status: inpatient    Fayrene Helper MD [facility] Pager AMION  If 7PM-7AM, please contact night-coverage [email or web address] Password [email or web address]  09/20/2019, 2:18 PM

## 2019-09-07 NOTE — Progress Notes (Signed)
Patient settled to Room 161 at [facility], tx from [facility].  Pleasant gentleman, alert and oriented to self, place, and situation.  Easily reoriented to date and time.  Steady with standby assistance out of bed.  Afib on tele with Cardizem IV drip infusing at 73ml/hr.  Requesting continuation of his DNR status.  MD notified and aware. Patient oriented to room and use of remote and how to call for assistance.  Call bell within reach.      09/16/2019 1105  Vitals  Temp 97.6 F (36.4 C)  Temp Source Oral  BP (!) 129/94  MAP (mmHg) 106  BP Location Left Arm  BP Method Automatic  Patient Position (if appropriate) Lying  Pulse Rate (!) 120  Pulse Rate Source Monitor  ECG Heart Rate (!) 120  Cardiac Rhythm Atrial fibrillation  Resp 16  Oxygen Therapy  SpO2 97 %  O2 Device Nasal Cannula  O2 Flow Rate (L/min) 4 L/min  Patient Activity (if Appropriate) In bed  Pulse Oximetry Type Continuous  Oximetry Probe Site Changed Yes (right ear lobe)  Pain Assessment  Pain Scale 0-10  Pain Score 0  MEWS Score  MEWS RR 0  MEWS Pulse 2  MEWS Systolic 0  MEWS LOC 0  MEWS Temp 0  MEWS Score 2  MEWS Score Color Yellow  MEWS Assessment  Is this an acute change? No (patient experiencing Afib RVR, on IV cardizem gtt)  Provider Notification  Provider Name/Title Dr. Florene Glen  Date Provider Notified 10/03/2019  Time Provider Notified 1110  Notification Type Page  Notification Reason Other (Comment) ([place] notification)  Response See new orders (pt admission orders)  Date of Provider Response 09/30/2019  Time of Provider Response [phone]

## 2019-09-07 NOTE — Progress Notes (Signed)
CRITICAL VALUE ALERT  Critical Value:  Troponin 254  Date & Time Notied:  10/05/2019 at 15:22  Provider Notified: Dr. Florene Glen  Orders Received/Actions taken: No orders received at present time, troponin is down trending.  Likely demand ischemia per MD 2/2 Afib RVR.  Patient remains chest pain/ pain free.

## 2019-09-07 NOTE — Progress Notes (Signed)
ANTICOAGULATION CONSULT NOTE - Initial Consult  Pharmacy Consult for Warfarin Indication: atrial fibrillation  Allergies  Allergen Reactions  . Lisinopril Shortness Of Breath       . Ticagrelor Shortness Of Breath and Other (See Comments)    * Brilinta* Pt short of breath  . Cephalexin Diarrhea and Nausea And Vomiting       . Prednisone Other (See Comments)    GI Issues  . Simvastatin Other (See Comments)    Unsure  . Statins Diarrhea    Patient Measurements: Height: 6' (182.9 cm) Weight: 189 lb 9.5 oz (86 kg) IBW/kg (Calculated) : 77.6   Vital Signs: Temp: 97.6 F (36.4 C) (01/01 1105) Temp Source: Oral (01/01 1105) BP: 151/82 (01/01 1307) Pulse Rate: 127 (01/01 1307)  Labs: Recent Labs    09/14/2019 1200  LABPROT 35.9*  INR 3.6*    CrCl cannot be calculated (Patient's most recent lab result is older than the maximum 21 days allowed.).   Medical History: Past Medical History:  Diagnosis Date  . AAA (abdominal aortic aneurysm) (HCC)    small. An abdominal ultrasound in 2011 showed no aneurysm  . ACS (acute coronary syndrome) ([place])   . Arthritis   . Atrial fibrillation ([place])    permanent  . Coronary artery disease   . Diabetes mellitus without complication ([place])   . Heart attack ([place])    x4   . Hyperlipidemia   . Hypertension   . Mitral regurgitation    with MVP. Mild to moderate  . Myocardial infarction ([place])   . Renal artery stenosis ([place])   . Renal artery stenosis in 1 of 2 vessels ([place]) 2006   subtotal occlusion of right renal artery. Treated medically.   . Sexual dysfunction     Medications:  Warfarin  5 mg daily PTA  Assessment: 79 y/o with a h/o AF on warfarin admitted with COVID-19 virus infection. Awaiting med history for last dose of warfarin.   INR 3.6, no bleeding reported No significant drug interactions currently  Goal of Therapy:  INR 2-3 Monitor platelets by anticoagulation protocol: Yes   Plan:  -Hold warfarin -Daily  PT/INR -Monitor for s/s of bleeding  Ulice Dash D 09/23/2019,1:22 PM

## 2019-09-07 NOTE — Progress Notes (Signed)
PHARMACIST - PHYSICIAN ORDER COMMUNICATION  CONCERNING: P&T Medication Policy on Herbal Medications  DESCRIPTION:  This patient's order for:  CoQ-10  has been noted.  This product(s) is classified as an "herbal" or natural product. Due to a lack of definitive safety studies or FDA approval, nonstandard manufacturing practices, plus the potential risk of unknown drug-drug interactions while on inpatient medications, the Pharmacy and Therapeutics Committee does not permit the use of "herbal" or natural products of this type within [facility].   ACTION TAKEN: The pharmacy department is unable to verify this order at this time and your patient has been informed of this safety policy. Please reevaluate patient's clinical condition at discharge and address if the herbal or natural product(s) should be resumed at that time.   Peggyann Juba, PharmD, BCPS Pharmacy: [phone] 10/05/2019 3:01 PM

## 2019-09-07 NOTE — Progress Notes (Signed)
Spoke with pt primary RN @ [facility] ED, notified that positive Covid result needs to be faxed to @ [phone] prior to pt being admitted to [facility]. Call [phone] with questions.

## 2019-09-07 NOTE — Progress Notes (Signed)
Inpatient Diabetes Program Recommendations  AACE/ADA: New Consensus Statement on Inpatient Glycemic Control (2015)  Target Ranges:  Prepandial:   less than 140 mg/dL      Peak postprandial:   less than 180 mg/dL (1-2 hours)      Critically ill patients:  140 - 180 mg/dL   Lab Results  Component Value Date   GLUCAP 182 (H) 09/28/2019   HGBA1C 7.0 (H) 05/05/2019    Review of Glycemic Control  Diabetes history:  Outpatient Diabetes medications: Lantus 10 units QHS, glipizide 5 mg Q24H Current orders for Inpatient glycemic control: Levemir 6 units bid, Novolog 0-15 units Q4H  On Solumedrol 40 mg Q12H.  Inpatient Diabetes Program Recommendations:     Agree with orders.  Will follow glucose trends.  Thank you. Lorenda Peck, RD, LDN, CDE Inpatient Diabetes Coordinator [phone]

## 2019-09-08 LAB — COMPREHENSIVE METABOLIC PANEL
ALT: 39 U/L (ref 0–44)
AST: 53 U/L — ABNORMAL HIGH (ref 15–41)
Albumin: 3.2 g/dL — ABNORMAL LOW (ref 3.5–5.0)
Alkaline Phosphatase: 55 U/L (ref 38–126)
Anion gap: 13 (ref 5–15)
BUN: 66 mg/dL — ABNORMAL HIGH (ref 8–23)
CO2: 18 mmol/L — ABNORMAL LOW (ref 22–32)
Calcium: 8.8 mg/dL — ABNORMAL LOW (ref 8.9–10.3)
Chloride: 107 mmol/L (ref 98–111)
Creatinine, Ser: 2.79 mg/dL — ABNORMAL HIGH (ref 0.61–1.24)
GFR calc Af Amer: 24 mL/min — ABNORMAL LOW (ref >60)
GFR calc non Af Amer: 21 mL/min — ABNORMAL LOW (ref >60)
Glucose, Bld: 144 mg/dL — ABNORMAL HIGH (ref 70–99)
Potassium: 4 mmol/L (ref 3.5–5.1)
Sodium: 138 mmol/L (ref 135–145)
Total Bilirubin: 0.8 mg/dL (ref 0.3–1.2)
Total Protein: 6.6 g/dL (ref 6.5–8.1)

## 2019-09-08 LAB — PROTIME-INR
INR: 4.8 (ref 0.8–1.2)
Prothrombin Time: 45 seconds — ABNORMAL HIGH (ref 11.4–15.2)

## 2019-09-08 LAB — PROCALCITONIN: Procalcitonin: 0.75 ng/mL

## 2019-09-08 LAB — FERRITIN: Ferritin: 681 ng/mL — ABNORMAL HIGH (ref 24–336)

## 2019-09-08 LAB — PHOSPHORUS: Phosphorus: 2.8 mg/dL (ref 2.5–4.6)

## 2019-09-08 LAB — CBC WITH DIFFERENTIAL/PLATELET
Abs Immature Granulocytes: 0.03 10*3/uL (ref 0.00–0.07)
Basophils Absolute: 0 10*3/uL (ref 0.0–0.1)
Basophils Relative: 0 %
Eosinophils Absolute: 0 10*3/uL (ref 0.0–0.5)
Eosinophils Relative: 0 %
HCT: 38 % — ABNORMAL LOW (ref 39.0–52.0)
Hemoglobin: 12.5 g/dL — ABNORMAL LOW (ref 13.0–17.0)
Immature Granulocytes: 1 %
Lymphocytes Relative: 6 %
Lymphs Abs: 0.4 10*3/uL — ABNORMAL LOW (ref 0.7–4.0)
MCH: 29.3 pg (ref 26.0–34.0)
MCHC: 32.9 g/dL (ref 30.0–36.0)
MCV: 89.2 fL (ref 80.0–100.0)
Monocytes Absolute: 0.2 10*3/uL (ref 0.1–1.0)
Monocytes Relative: 3 %
Neutro Abs: 5.3 10*3/uL (ref 1.7–7.7)
Neutrophils Relative %: 90 %
Platelets: 62 10*3/uL — ABNORMAL LOW (ref 150–400)
RBC: 4.26 MIL/uL (ref 4.22–5.81)
RDW: 14.6 % (ref 11.5–15.5)
WBC: 5.9 10*3/uL (ref 4.0–10.5)
nRBC: 0.3 % — ABNORMAL HIGH (ref 0.0–0.2)

## 2019-09-08 LAB — MAGNESIUM: Magnesium: 2 mg/dL (ref 1.7–2.4)

## 2019-09-08 LAB — D-DIMER, QUANTITATIVE: D-Dimer, Quant: >20 ug/mL-FEU — ABNORMAL HIGH (ref 0.00–0.50)

## 2019-09-08 LAB — GLUCOSE, CAPILLARY
Glucose-Capillary: 148 mg/dL — ABNORMAL HIGH (ref 70–99)
Glucose-Capillary: 142 mg/dL — ABNORMAL HIGH (ref 70–99)

## 2019-09-08 LAB — C-REACTIVE PROTEIN: CRP: 19.8 mg/dL — ABNORMAL HIGH (ref <1.0)

## 2019-09-08 MED ORDER — FENTANYL CITRATE (PF) 100 MCG/2ML IJ SOLN
25.0000 ug | INTRAMUSCULAR | Status: DC | PRN
  Administered 2019-09-08: 25 ug via INTRAVENOUS
  Filled 2019-09-08: qty 2

## 2019-09-08 MED ORDER — ACETAMINOPHEN 325 MG PO TABS
650.0000 mg | ORAL_TABLET | Freq: Four times a day (QID) | ORAL | Status: DC | PRN
  Administered 2019-09-08: 650 mg via ORAL
  Filled 2019-09-08: qty 2

## 2019-09-11 ENCOUNTER — Encounter (HOSPITAL_COMMUNITY): Payer: Medicare Other

## 2019-09-11 ENCOUNTER — Ambulatory Visit: Payer: Medicare Other | Admitting: Vascular Surgery

## 2019-09-12 LAB — CULTURE, BLOOD (ROUTINE X 2)
Culture: NO GROWTH
Culture: NO GROWTH
Special Requests: ADEQUATE
Special Requests: ADEQUATE

## 2019-09-19 ENCOUNTER — Ambulatory Visit: Payer: Medicare Other | Admitting: Internal Medicine

## 2019-10-08 NOTE — Progress Notes (Signed)
   09/29/2019 1600  Pressure Injury Prevention  Positioning Frequency Able to turn self  Family/Significant Other Communication  Family/Significant Other Update Called;Updated;Other (Comment) (Patient's daughter, Santiago Glad [phone])   Received phone call from patient's daughter, Santiago Glad.  Provided updates on medications, oxygen requirements, VS, and current plan of care.  Santiago Glad confirms she has spoken with Dr. Florene Glen and our Pharmacist this afternoon also.  Family denied further questions or needs at present time.

## 2019-10-08 NOTE — Progress Notes (Signed)
Pt still complaining of pain. HR is still sustaining >120. Dr. Olevia Bowens notified of no  pain relief. Awaiting new order.

## 2019-10-08 NOTE — Progress Notes (Signed)
Prn metoprolol given for HR sustaining greater than 120. Pt also complains of pain. Tylenol ordered by Dr. Olevia Bowens. Will continue to monitor pt.

## 2019-10-08 NOTE — Progress Notes (Signed)
New order for Fentanyl prn given. HR 130's-150's. Administered prn fentanyl and metoprolol Pt not showing any sings of distress at this time. Will continue to monitor.

## 2019-10-08 NOTE — Death Summary Note (Signed)
DEATH SUMMARY   Patient Details  Name: Marc [name] MRN: [id number] DOB: 05/26/1941  Admission/Discharge Information   Admit Date:  Oct 04, 2019  Date of Death: Date of Death: Oct 05, 2019  Time of Death: Time of Death: 0455  Length of Stay: 1  Referring Physician: Raina Mina., MD   Reason(s) for Hospitalization  COVID 19 virus infection  Diagnoses  Preliminary cause of death:  Secondary Diagnoses (including complications and co-morbidities):  Principal Problem:   COVID-19 virus infection Active Problems:   Atrial fibrillation ([place])   Coronary artery disease   Hypertension   COPD (chronic obstructive pulmonary disease) ([place])   Chronic combined systolic and diastolic congestive heart failure ([place])   Chronic kidney disease, stage III (moderate)   Long term current use of anticoagulant therapy   Type 2 diabetes mellitus without complication, with long-term current use of insulin ([facility])   Elevated troponin I level   Brief Hospital Course (including significant findings, care, treatment, and services provided and events leading to death)  ARACELI ORVIS is Shelia Kingsberry 79 y.o. male with medical history significant of atrial fibrillation on warfarin, CAD, T2DM, HTN, HLD and multiple other medical problems presenting after feeling poorly for several days.    He notes worsening SOB and lethargy and weakness.  He's noted fevers and decreased appetite.  Describes general malaise and mild diarrhea.  Denies CP or SOB.  Denies N/V, change in taste or smell.  He was diagnosed with COVID on 12/28 at [facility] ED.  At that time he mainly complained of abdominal pain and diarrhea.  He came to the ED at his daughters urging and was transferred to [facility] for further care.  Denies smoking or drinking.   He was started on steroids and remdesivir and initially requiring only 4 L.  Hospitalization was complicated by elevated troponin thought likely 2/2 demand ischemia from afib with RVR as the patient  was chest pain free and troponin was downtrending.  His RVR was being treated with beta blockers, diltiazem was discontinued due to his decreased EF.  He was also noted to have AKI which appeared to be improving based on OSH labs from [facility] few days prior.  He had an elevated d dimer, but was on warfarin with Ivo Moga supratherapeutic INR.    Per review of the chart, on 10-05-19 AM he was found hypoxic and unresponsive.  He was placed on Daequan Kozma NRB and had no change in status.  He passed away at 0455 on October 05, 2019.    Pertinent Labs and Studies  Significant Diagnostic Studies CT ABDOMEN PELVIS WO CONTRAST  Result Date: 08/24/2019 CLINICAL DATA:  Lower abdominal pain and bloating. Diarrhea for several months. EXAM: CT ABDOMEN AND PELVIS WITHOUT CONTRAST TECHNIQUE: Multidetector CT imaging of the abdomen and pelvis was performed following the standard protocol without IV contrast. COMPARISON:  MRI abdomen 02/19/2019 FINDINGS: Lower chest: Heart is enlarged. Coronary artery calcification is evident. Hepatobiliary: No focal abnormality in the liver on this study without intravenous contrast. There is no evidence for gallstones, gallbladder wall thickening, or pericholecystic fluid. No intrahepatic or extrahepatic biliary dilation. Pancreas: Tiny cystic lesion seen in the body of pancreas on previous MRI is not evident on noncontrast CT. No main duct dilatation. Spleen: No splenomegaly. No focal mass lesion. Adrenals/Urinary Tract: No adrenal nodule or mass. Scattered tiny nonobstructing renal stones and/or central vascular calcification noted in the right kidney. No hydronephrosis. 15 mm low-density lesion interpolar left kidney compatible with cyst seen on previous MRI.  3 mm stone identified mid right ureter on axial 59/series 2. No right hydroureteronephrosis. 4 x 7 mm stone identified distal left ureter about 1-2 cm proximal to the UVJ (image 77/series 2). No left hydroureteronephrosis. No bladder stones. Stomach/Bowel: Tiny  hiatal hernia. Stomach otherwise unremarkable. Duodenum is normally positioned as is the ligament of Treitz. No small bowel wall thickening. No small bowel dilatation. The terminal ileum is normal. The appendix is normal. No gross colonic mass. No colonic wall thickening. Diverticuli are seen scattered along the entire length of the colon without CT findings of diverticulitis. Vascular/Lymphatic: There is abdominal aortic atherosclerosis without aneurysm. There is no gastrohepatic or hepatoduodenal ligament lymphadenopathy. No retroperitoneal or mesenteric lymphadenopathy. No pelvic sidewall lymphadenopathy. Right common iliac artery measures 2.1 cm diameter. Reproductive: The prostate gland and seminal vesicles are unremarkable. Other: No intraperitoneal free fluid. Musculoskeletal: Posttraumatic deformity noted right iliac bone. No worrisome lytic or sclerotic osseous abnormality. Degenerative disc disease noted lumbar spine, most prominent at L2-3 and L5-S1. IMPRESSION: 1. Multiple nonobstructing right renal stones with bilateral nonobstructing ureteral stones. 2. Diffuse colonic diverticulosis without diverticulitis. 3. Tiny hiatal hernia. 4. Tiny cystic lesion identified in the body of pancreas on previous MRI is not visible on today's noncontrast CT. 5.  Aortic Atherosclerois (ICD10-170.0) Electronically Signed   By: Misty Stanley M.D.   On: 08/24/2019 09:53   DG CHEST PORT 1 VIEW  Result Date: 09/15/2019 CLINICAL DATA:  COVID-19 virus infection EXAM: PORTABLE CHEST 1 VIEW COMPARISON:  Chest radiograph 09/06/2019 FINDINGS: Stable cardiomediastinal contours with enlarged heart size. There are bilateral infiltrates in the mid to lower lungs, which appear increased at the right lung base. No pneumothorax or large pleural effusion. IMPRESSION: Bilateral infiltrates which appear increased at the right lung base compared to prior. Electronically Signed   By: Audie Pinto M.D.   On: 09/24/2019 11:55     Microbiology Recent Results (from the past 240 hour(s))  Culture, blood (routine x 2)     Status: None (Preliminary result)   Collection Time: 09/27/2019  3:16 PM   Specimen: BLOOD  Result Value Ref Range Status   Specimen Description   Final    BLOOD LEFT ARM Performed at [facility] [place], [place] [address], [place], [place] [address]    Special Requests   Final    BOTTLES DRAWN AEROBIC ONLY Blood Culture adequate volume Performed at [facility] [address], [place], [place] [address]    Culture   Final    NO GROWTH < 12 HOURS Performed at [facility] [address], [place], [place] [address]    Report Status PENDING  Incomplete  Culture, blood (routine x 2)     Status: None (Preliminary result)   Collection Time: 09/10/2019  3:21 PM   Specimen: BLOOD  Result Value Ref Range Status   Specimen Description   Final    BLOOD LEFT ARM Performed at [facility] [address] [place], [place] [address]    Special Requests   Final    BOTTLES DRAWN AEROBIC ONLY Blood Culture adequate volume Performed at [facility] [address] [place], [place] [address]    Culture   Final    NO GROWTH < 12 HOURS Performed at [facility] [address] [place], [place] [address]    Report Status PENDING  Incomplete    Lab Basic Metabolic Panel: Recent Labs  Lab 09/21/2019 1200 September 27, 2019 0103  NA 139 138  K 4.1 4.0  CL 106 107  CO2 19* 18*  GLUCOSE 188* 144*  BUN 51* 66*  CREATININE 2.05* 2.79*  CALCIUM 8.9 8.8*  MG 1.6* 2.0  PHOS 3.0 2.8   Liver Function Tests: Recent Labs  Lab 09/25/2019 1200 11-Sep-2019 0103  AST 48* 53*  ALT 41 39  ALKPHOS 52 55  BILITOT 1.0 0.8  PROT 6.9 6.6  ALBUMIN 3.4* 3.2*   No results for input(s): LIPASE, AMYLASE in the last 168 hours. No results for input(s): AMMONIA in the last 168 hours. CBC: Recent Labs  Lab 09/13/2019 1200 Sep 11, 2019 0103  WBC 4.4  5.9  NEUTROABS 3.8 5.3  HGB 13.3 12.5*  HCT 40.4 38.0*  MCV 89.4 89.2  PLT 109* 62*   Cardiac Enzymes: No results for input(s): CKTOTAL, CKMB, CKMBINDEX, TROPONINI in the last 168 hours. Sepsis Labs: Recent Labs  Lab 10/03/2019 1200 2019/09/11 0103  PROCALCITON 0.50 0.75  WBC 4.4 5.9    Procedures/Operations  See prior notes   Fayrene Helper 11-Sep-2019, 7:21 PM

## 2019-10-08 NOTE — Progress Notes (Signed)
Monitor alarming showing pt off O2 monitoring. Upon arrival to pt's room to place back on monitor pt found to be unresponsive was pale. O2 sats 45% HR dropping. Placed pt on NRB w/ no change in status. MD was called and notified. Was seen by Tressia Miners, RN that pt had taken last breath. Pulses on assessment weak. Pt asystole read on monitor. MD notified. Order for RN's to pronounce given. Next of kin notified.

## 2019-10-08 NOTE — Plan of Care (Signed)
  Problem: Education: Goal: Knowledge of risk factors and measures for prevention of condition will improve Outcome: Progressing   Problem: Coping: Goal: Psychosocial and spiritual needs will be supported Outcome: Progressing   Problem: Respiratory: Goal: Will maintain a patent airway Outcome: Progressing Goal: Complications related to the disease process, condition or treatment will be avoided or minimized Outcome: Progressing   Problem: Health Behavior/Discharge Planning: Goal: Ability to manage health-related needs will improve Outcome: Progressing   Problem: Clinical Measurements: Goal: Ability to maintain clinical measurements within normal limits will improve Outcome: Progressing Goal: Will remain free from infection Outcome: Progressing Goal: Diagnostic test results will improve Outcome: Progressing   Problem: Nutrition: Goal: Adequate nutrition will be maintained Outcome: Progressing   Problem: Coping: Goal: Level of anxiety will decrease Outcome: Progressing   Problem: Pain Managment: Goal: General experience of comfort will improve Outcome: Progressing   Problem: Safety: Goal: Ability to remain free from injury will improve Outcome: Progressing

## 2019-10-08 DEATH — deceased

## 2019-11-26 ENCOUNTER — Ambulatory Visit: Payer: Medicare Other | Admitting: Cardiology

## 2020-06-18 IMAGING — DX DG CHEST 1V PORT
1 series · 1 of 1 positions shown · non-contrast
Comparison: Chest radiograph 09/06/2019

CLINICAL DATA: VEZER-AG virus infection

EXAM:
PORTABLE CHEST 1 VIEW

[chest]
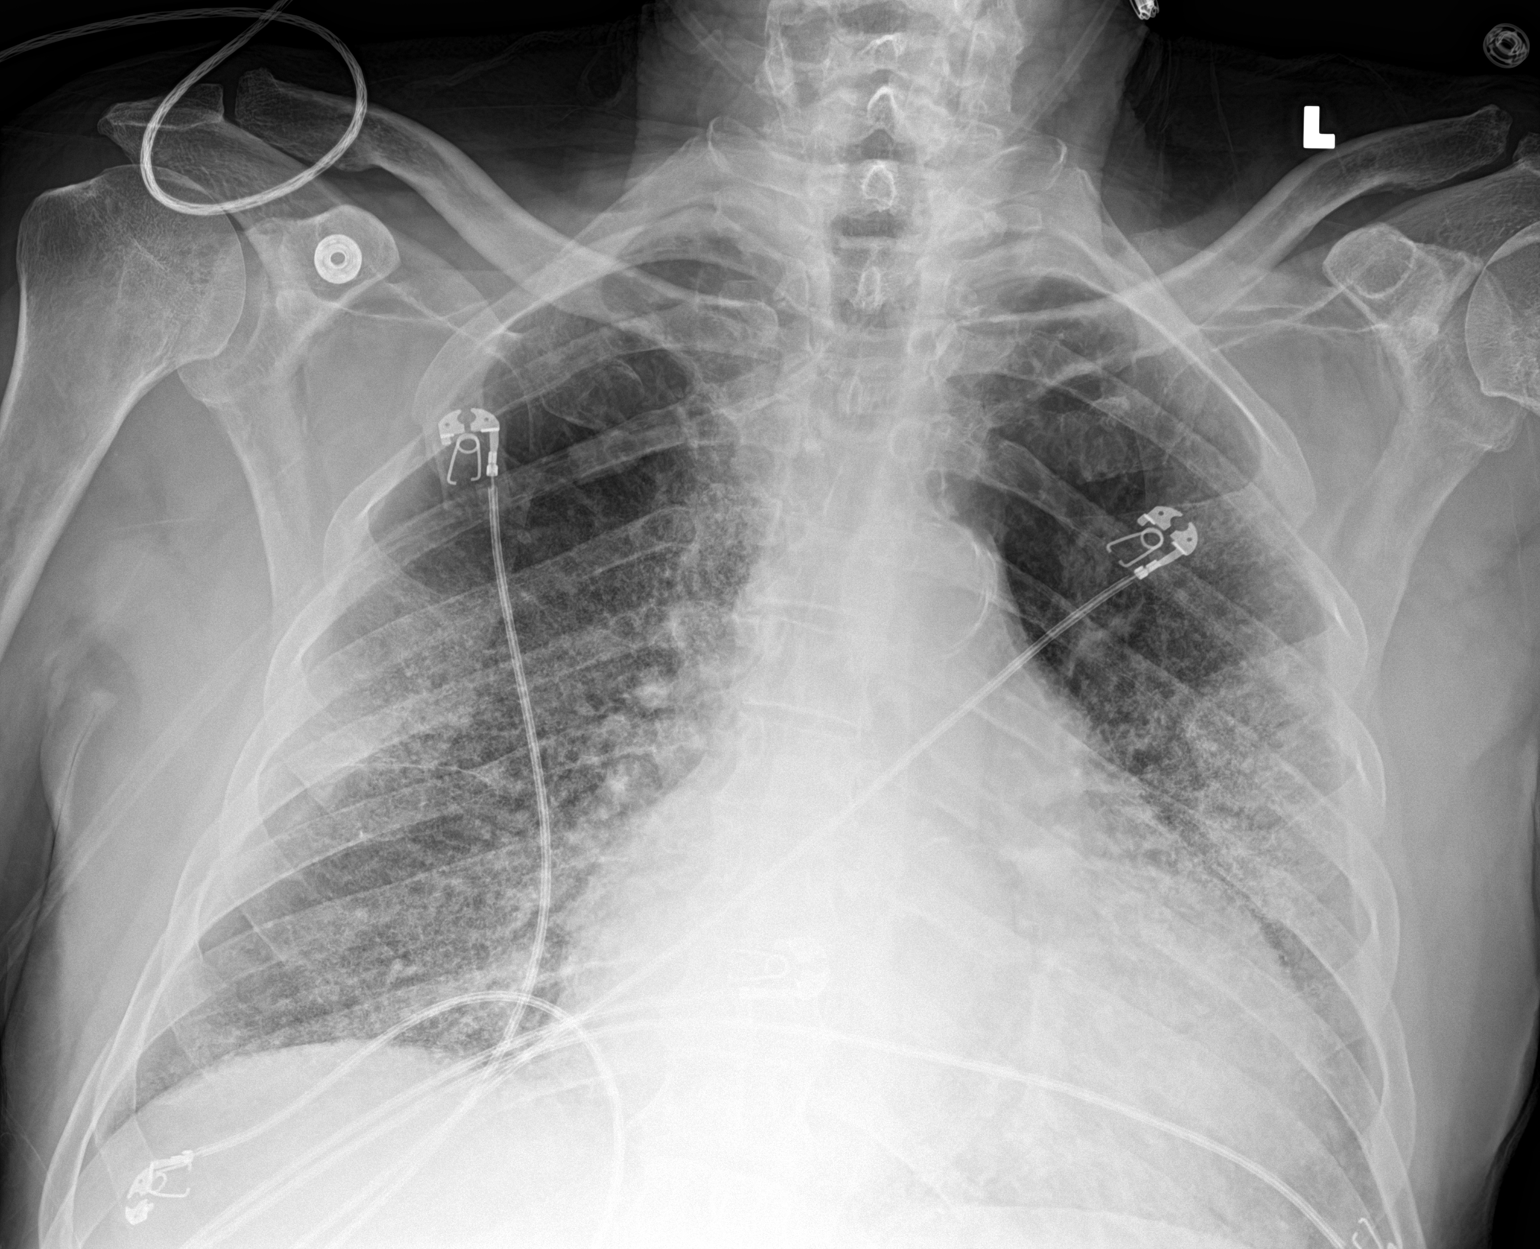

[1 of 1 positions shown; findings below may reference images not displayed]

FINDINGS: Stable cardiomediastinal contours with enlarged heart size. There
are bilateral infiltrates in the mid to lower lungs, which appear
increased at the right lung base. No pneumothorax or large pleural
effusion.
IMPRESSION: Bilateral infiltrates which appear increased at the right lung base
compared to prior.

## 2020-10-07 DEATH — deceased

## 2022-04-06 NOTE — Progress Notes (Signed)
   03/31/22 1555  Clinical Encounter Type  Visited With Family  Visit Type Initial  Referral From Nurse  Consult/Referral To Chaplain   Chaplain responded to a phone call requesting support for a family that was meeting with Dr. Selena Batten.  Patient died and Dr. Selena Batten was providing an update as to what transpired. I provided support for the family as they received the information and digested it. Dr. Selena Batten had made arrangements for the family to go to the morgue if they wished to view their loved one.  It took time to gather the family and take them down, but they were able to view their loved one say goodbye and have the closure they needed.   Danice Goltz [facility] [phone]
